# Patient Record
Sex: Female | Born: 1990 | Race: Black or African American | Hispanic: No | Marital: Single | Smoking: Never smoker
Health system: Southern US, Community
[De-identification: ages and names within clinical notes are randomized; demographics above are authoritative.]

## PROBLEM LIST (undated history)

## (undated) DIAGNOSIS — Z9289 Personal history of other medical treatment: Secondary | ICD-10-CM

## (undated) DIAGNOSIS — N12 Tubulo-interstitial nephritis, not specified as acute or chronic: Secondary | ICD-10-CM

## (undated) DIAGNOSIS — Z789 Other specified health status: Secondary | ICD-10-CM

## (undated) DIAGNOSIS — Z9889 Other specified postprocedural states: Secondary | ICD-10-CM

## (undated) HISTORY — DX: Other specified health status: Z78.9

---

## 2014-04-09 ENCOUNTER — Other Ambulatory Visit (HOSPITAL_COMMUNITY)
Admission: RE | Admit: 2014-04-09 | Discharge: 2014-04-09 | Disposition: A | Discharge status: Home / Self Care | Payer: BC Managed Care – PPO | Source: Ambulatory Visit | Attending: Obstetrics & Gynecology | Admitting: Obstetrics & Gynecology

## 2014-04-09 ENCOUNTER — Other Ambulatory Visit: Payer: Self-pay | Admitting: Obstetrics & Gynecology

## 2014-04-09 DIAGNOSIS — Z1151 Encounter for screening for human papillomavirus (HPV): Secondary | ICD-10-CM | POA: Insufficient documentation

## 2014-04-09 DIAGNOSIS — Z113 Encounter for screening for infections with a predominantly sexual mode of transmission: Secondary | ICD-10-CM | POA: Insufficient documentation

## 2014-04-09 DIAGNOSIS — Z124 Encounter for screening for malignant neoplasm of cervix: Secondary | ICD-10-CM | POA: Insufficient documentation

## 2014-04-12 LAB — CYTOLOGY - PAP

## 2014-07-19 ENCOUNTER — Other Ambulatory Visit: Payer: Self-pay | Admitting: Obstetrics & Gynecology

## 2014-07-19 ENCOUNTER — Other Ambulatory Visit (HOSPITAL_COMMUNITY)
Admission: RE | Admit: 2014-07-19 | Discharge: 2014-07-19 | Disposition: A | Discharge status: Home / Self Care | Payer: BC Managed Care – PPO | Source: Ambulatory Visit | Attending: Obstetrics & Gynecology | Admitting: Obstetrics & Gynecology

## 2014-07-19 DIAGNOSIS — Z01411 Encounter for gynecological examination (general) (routine) with abnormal findings: Secondary | ICD-10-CM | POA: Insufficient documentation

## 2014-07-21 LAB — CYTOLOGY - PAP

## 2019-02-27 DIAGNOSIS — Z3689 Encounter for other specified antenatal screening: Secondary | ICD-10-CM | POA: Diagnosis not present

## 2019-02-27 DIAGNOSIS — Z3A10 10 weeks gestation of pregnancy: Secondary | ICD-10-CM | POA: Diagnosis not present

## 2020-09-05 DIAGNOSIS — O26899 Other specified pregnancy related conditions, unspecified trimester: Secondary | ICD-10-CM | POA: Diagnosis not present

## 2020-09-05 DIAGNOSIS — Z3A11 11 weeks gestation of pregnancy: Secondary | ICD-10-CM | POA: Diagnosis not present

## 2020-09-07 DIAGNOSIS — Z3490 Encounter for supervision of normal pregnancy, unspecified, unspecified trimester: Secondary | ICD-10-CM | POA: Diagnosis not present

## 2020-09-07 DIAGNOSIS — Z113 Encounter for screening for infections with a predominantly sexual mode of transmission: Secondary | ICD-10-CM | POA: Diagnosis not present

## 2020-09-07 LAB — OB RESULTS CONSOLE ABO/RH: RH Type: POSITIVE

## 2020-09-07 LAB — OB RESULTS CONSOLE HEPATITIS B SURFACE ANTIGEN: Hepatitis B Surface Ag: NEGATIVE

## 2020-09-07 LAB — OB RESULTS CONSOLE RPR: RPR: NONREACTIVE

## 2020-09-07 LAB — OB RESULTS CONSOLE HIV ANTIBODY (ROUTINE TESTING): HIV: NONREACTIVE

## 2020-09-07 LAB — OB RESULTS CONSOLE ANTIBODY SCREEN: Antibody Screen: NEGATIVE

## 2020-09-07 LAB — OB RESULTS CONSOLE GC/CHLAMYDIA
Chlamydia: NEGATIVE
Gonorrhea: NEGATIVE

## 2020-09-07 LAB — OB RESULTS CONSOLE RUBELLA ANTIBODY, IGM: Rubella: IMMUNE

## 2020-10-01 NOTE — L&D Delivery Note (Addendum)
Delivery Note Called to room for patient for complete dilation. Patient found to be complete and 0 to +1 station. Bed broken down and patient draped. At 7:38 PM a viable and healthy female was delivered via Vaginal, Spontaneous (Presentation: Right Occiput Anterior).  APGAR: 8,9 ; weight - see infant's chart.   Placenta status: Spontaneous, Intact.  Cord: 3 vessels with the following complications: None.  Cord pH: Not collected.  After delivery, brisk bleeding was noted prior to delivery of placenta. Pitocin was started. Placenta delivered intact. Bimanual massage performed with removal of lower uterine segment blood clots. Uterus found to be firm. Methergine 0.2mg  IM x 1 was given. Cervix, vagina, and perineum inspected and no lacerations were found. TXA 1g was given. Cytotec was placed per rectum as prophylaxis.  Anesthesia: Epidural Episiotomy: None Lacerations: None Suture Repair:  N/A Est. Blood Loss (mL):  500cc  Mom to postpartum.  Baby to Couplet care / Skin to Skin.  Steva Ready 03/22/2021, 7:53 PM

## 2021-03-03 LAB — OB RESULTS CONSOLE GBS: GBS: NEGATIVE

## 2021-03-10 ENCOUNTER — Encounter (HOSPITAL_COMMUNITY): Payer: Self-pay | Admitting: Obstetrics and Gynecology

## 2021-03-10 ENCOUNTER — Inpatient Hospital Stay (HOSPITAL_COMMUNITY): Payer: 59

## 2021-03-10 ENCOUNTER — Observation Stay (HOSPITAL_COMMUNITY)
Admission: EM | Admit: 2021-03-10 | Discharge: 2021-03-12 | Disposition: A | Discharge status: Home / Self Care | Payer: 59 | Attending: Obstetrics and Gynecology | Admitting: Obstetrics and Gynecology

## 2021-03-10 ENCOUNTER — Other Ambulatory Visit: Payer: Self-pay

## 2021-03-10 DIAGNOSIS — O26893 Other specified pregnancy related conditions, third trimester: Secondary | ICD-10-CM

## 2021-03-10 DIAGNOSIS — Z20822 Contact with and (suspected) exposure to covid-19: Secondary | ICD-10-CM | POA: Diagnosis not present

## 2021-03-10 DIAGNOSIS — Z3A37 37 weeks gestation of pregnancy: Secondary | ICD-10-CM | POA: Insufficient documentation

## 2021-03-10 DIAGNOSIS — O368330 Maternal care for abnormalities of the fetal heart rate or rhythm, third trimester, not applicable or unspecified: Secondary | ICD-10-CM | POA: Diagnosis not present

## 2021-03-10 DIAGNOSIS — O2300 Infections of kidney in pregnancy, unspecified trimester: Secondary | ICD-10-CM | POA: Diagnosis present

## 2021-03-10 DIAGNOSIS — Z3A00 Weeks of gestation of pregnancy not specified: Secondary | ICD-10-CM | POA: Diagnosis not present

## 2021-03-10 DIAGNOSIS — O2303 Infections of kidney in pregnancy, third trimester: Secondary | ICD-10-CM | POA: Diagnosis not present

## 2021-03-10 DIAGNOSIS — R52 Pain, unspecified: Secondary | ICD-10-CM

## 2021-03-10 DIAGNOSIS — R509 Fever, unspecified: Secondary | ICD-10-CM | POA: Diagnosis not present

## 2021-03-10 DIAGNOSIS — R102 Pelvic and perineal pain: Secondary | ICD-10-CM | POA: Diagnosis not present

## 2021-03-10 DIAGNOSIS — O368390 Maternal care for abnormalities of the fetal heart rate or rhythm, unspecified trimester, not applicable or unspecified: Secondary | ICD-10-CM

## 2021-03-10 LAB — COMPREHENSIVE METABOLIC PANEL
ALT: 15 U/L (ref 0–44)
AST: 16 U/L (ref 15–41)
Albumin: 2.8 g/dL — ABNORMAL LOW (ref 3.5–5.0)
Alkaline Phosphatase: 131 U/L — ABNORMAL HIGH (ref 38–126)
Anion gap: 11 (ref 5–15)
BUN: 8 mg/dL (ref 6–20)
CO2: 17 mmol/L — ABNORMAL LOW (ref 22–32)
Calcium: 9 mg/dL (ref 8.9–10.3)
Chloride: 106 mmol/L (ref 98–111)
Creatinine, Ser: 0.74 mg/dL (ref 0.44–1.00)
GFR, Estimated: >60 mL/min (ref >60)
Glucose, Bld: 121 mg/dL — ABNORMAL HIGH (ref 70–99)
Potassium: 3.4 mmol/L — ABNORMAL LOW (ref 3.5–5.1)
Sodium: 134 mmol/L — ABNORMAL LOW (ref 135–145)
Total Bilirubin: 0.8 mg/dL (ref 0.3–1.2)
Total Protein: 6.4 g/dL — ABNORMAL LOW (ref 6.5–8.1)

## 2021-03-10 LAB — CBC WITH DIFFERENTIAL/PLATELET
Abs Immature Granulocytes: 0.4 10*3/uL — ABNORMAL HIGH (ref 0.00–0.07)
Basophils Absolute: 0 10*3/uL (ref 0.0–0.1)
Basophils Relative: 0 %
Eosinophils Absolute: 0 10*3/uL (ref 0.0–0.5)
Eosinophils Relative: 0 %
HCT: 34.5 % — ABNORMAL LOW (ref 36.0–46.0)
Hemoglobin: 11.3 g/dL — ABNORMAL LOW (ref 12.0–15.0)
Immature Granulocytes: 3 %
Lymphocytes Relative: 5 %
Lymphs Abs: 0.6 10*3/uL — ABNORMAL LOW (ref 0.7–4.0)
MCH: 27.8 pg (ref 26.0–34.0)
MCHC: 32.8 g/dL (ref 30.0–36.0)
MCV: 85 fL (ref 80.0–100.0)
Monocytes Absolute: 0.6 10*3/uL (ref 0.1–1.0)
Monocytes Relative: 5 %
Neutro Abs: 11.3 10*3/uL — ABNORMAL HIGH (ref 1.7–7.7)
Neutrophils Relative %: 87 %
Platelets: 192 10*3/uL (ref 150–400)
RBC: 4.06 MIL/uL (ref 3.87–5.11)
RDW: 15.1 % (ref 11.5–15.5)
WBC: 12.9 10*3/uL — ABNORMAL HIGH (ref 4.0–10.5)
nRBC: 0 % (ref 0.0–0.2)

## 2021-03-10 LAB — URINALYSIS, ROUTINE W REFLEX MICROSCOPIC
Bilirubin Urine: NEGATIVE
Glucose, UA: NEGATIVE mg/dL
Ketones, ur: NEGATIVE mg/dL
Nitrite: NEGATIVE
Protein, ur: 100 mg/dL — AB
RBC / HPF: >50 RBC/hpf — ABNORMAL HIGH (ref 0–5)
Specific Gravity, Urine: 1.016 (ref 1.005–1.030)
WBC, UA: >50 WBC/hpf — ABNORMAL HIGH (ref 0–5)
pH: 6 (ref 5.0–8.0)

## 2021-03-10 LAB — RESP PANEL BY RT-PCR (FLU A&B, COVID) ARPGX2
Influenza A by PCR: NEGATIVE
Influenza B by PCR: NEGATIVE
SARS Coronavirus 2 by RT PCR: NEGATIVE

## 2021-03-10 MED ORDER — SODIUM CHLORIDE 0.9 % IV SOLN
2.0000 g | Freq: Once | INTRAVENOUS | Status: AC
  Administered 2021-03-10: 2 g via INTRAVENOUS
  Filled 2021-03-10: qty 20

## 2021-03-10 MED ORDER — SODIUM CHLORIDE 0.9 % IV SOLN
INTRAVENOUS | Status: DC
Start: 2021-03-10 — End: 2021-03-11

## 2021-03-10 MED ORDER — CALCIUM CARBONATE ANTACID 500 MG PO CHEW: 2.0000 | CHEWABLE_TABLET | ORAL | Status: DC | PRN

## 2021-03-10 MED ORDER — PRENATAL MULTIVITAMIN CH
1.0000 | ORAL_TABLET | Freq: Every day | ORAL | Status: DC
  Administered 2021-03-10 – 2021-03-12 (×2): 1 via ORAL
  Filled 2021-03-10 (×3): qty 1

## 2021-03-10 MED ORDER — DOCUSATE SODIUM 100 MG PO CAPS
100.0000 mg | ORAL_CAPSULE | Freq: Every day | ORAL | Status: DC
  Administered 2021-03-10 – 2021-03-12 (×3): 100 mg via ORAL
  Filled 2021-03-10 (×3): qty 1

## 2021-03-10 MED ORDER — SODIUM CHLORIDE 0.9 % IV SOLN
1.0000 g | INTRAVENOUS | Status: DC
  Administered 2021-03-11 – 2021-03-12 (×2): 1 g via INTRAVENOUS
  Filled 2021-03-10: qty 1
  Filled 2021-03-10: qty 10

## 2021-03-10 MED ORDER — LACTATED RINGERS IV SOLN: Freq: Once | INTRAVENOUS | Status: AC

## 2021-03-10 MED ORDER — BUTALBITAL-APAP-CAFFEINE 50-325-40 MG PO TABS
1.0000 | ORAL_TABLET | ORAL | Status: DC | PRN
  Administered 2021-03-10: 1 via ORAL
  Filled 2021-03-10: qty 1

## 2021-03-10 MED ORDER — HYDROMORPHONE HCL 1 MG/ML IJ SOLN
1.0000 mg | Freq: Once | INTRAMUSCULAR | Status: AC
  Administered 2021-03-10: 1 mg via INTRAVENOUS
  Filled 2021-03-10: qty 1

## 2021-03-10 MED ORDER — ZOLPIDEM TARTRATE 5 MG PO TABS
5.0000 mg | ORAL_TABLET | Freq: Every evening | ORAL | Status: DC | PRN
  Administered 2021-03-11: 5 mg via ORAL
  Filled 2021-03-10: qty 1

## 2021-03-10 MED ORDER — ACETAMINOPHEN 325 MG PO TABS
650.0000 mg | ORAL_TABLET | ORAL | Status: DC | PRN
  Administered 2021-03-10 (×2): 650 mg via ORAL
  Filled 2021-03-10 (×2): qty 2

## 2021-03-10 MED ORDER — CYCLOBENZAPRINE HCL 10 MG PO TABS
10.0000 mg | ORAL_TABLET | Freq: Three times a day (TID) | ORAL | Status: DC | PRN
  Administered 2021-03-10 – 2021-03-11 (×3): 10 mg via ORAL
  Filled 2021-03-10 (×3): qty 1

## 2021-03-10 MED ORDER — LACTATED RINGERS IV BOLUS
500.0000 mL | Freq: Once | INTRAVENOUS | Status: AC
  Administered 2021-03-10: 500 mL via INTRAVENOUS

## 2021-03-10 MED ORDER — SODIUM CHLORIDE 0.9 % IV SOLN: Freq: Once | INTRAVENOUS | Status: AC

## 2021-03-10 NOTE — ED Notes (Signed)
report given to Myra Gianotti at [facility]. Pt to go to waiting area via ED Tech and this RN

## 2021-03-10 NOTE — MAU Provider Note (Signed)
Chief Complaint:  Fever, Chills, Back Pain, and Dysuria   Event Date/Time   First Provider Initiated Contact with Patient 03/10/21 0342     HPI: Rachel Weiss is a 30 y.o. G3P0 at 39w2dwho presents to maternity admissions reporting fever and chills, seeing blood when she urinates. And pain in Suprapubic area and Left flank.  Also has some urinary frequency and urgency.   Took Tylenol before coming. . She reports good fetal movement, denies LOF, vaginal bleeding, vaginal itching/burning, urinary symptoms, h/a, dizziness, n/v, diarrhea, constipation or fever/chills.  She denies headache, visual changes or RUQ abdominal pain.  Fever  This is a new problem. The current episode started today. The problem has been gradually improving. Her temperature was unmeasured prior to arrival. Associated symptoms include abdominal pain (suprapubic), muscle aches and urinary pain. Pertinent negatives include no congestion, headaches, nausea or vomiting. She has tried acetaminophen for the symptoms. The treatment provided mild relief.  Dysuria  This is a new problem. The current episode started today. The problem occurs every urination. The quality of the pain is described as burning. The pain is moderate. There is No history of pyelonephritis. Associated symptoms include chills, frequency and hematuria. Pertinent negatives include no nausea or vomiting. She has tried acetaminophen for the symptoms. The treatment provided mild relief.   RN Note: Patient is approximately 8 months pregnant presenting to the emergency department with a chief complaint of generalized body aches, pelvic pain, vaginal discharge/bleeding.  She states that she feels a pelvic pressure.  She has not been pushing.  She is not certain if she has had a fluid gush.  She denies any sick contacts.  She has not taken a COVID test.  She is followed by [facility].  Past Medical History: History reviewed. No pertinent past medical history.  Past  obstetric history: OB History  Gravida Para Term Preterm AB Living  3         2  SAB IAB Ectopic Multiple Live Births               # Outcome Date GA Lbr Len/2nd Weight Sex Delivery Anes PTL Lv  3 Current           2 Gravida           1 [place]             Past Surgical History: History reviewed. No pertinent surgical history.  Family History: History reviewed. No pertinent family history.  Social History:    Allergies: Not on File  Meds:  No medications prior to admission.    I have reviewed patient's Past Medical Hx, Surgical Hx, Family Hx, Social Hx, medications and allergies.   ROS:  Review of Systems  Constitutional:  Positive for chills and fever.  HENT:  Negative for congestion.   Gastrointestinal:  Positive for abdominal pain (suprapubic). Negative for nausea and vomiting.  Genitourinary:  Positive for dysuria, frequency and hematuria.  Neurological:  Negative for headaches.  Other systems negative  Physical Exam  Patient Vitals for the past 24 hrs:  BP Temp Temp src Pulse Resp SpO2  03/10/21 0334 -- -- -- -- -- 98 %  03/10/21 0217 121/79 98.6 F (37 C) Oral (!) 151 20 98 %   Constitutional: Well-developed, well-nourished female in no acute distress, but quite uncomfortable.  Hesitant to allow abdomen to be touched.    Cardiovascular: normal rate and rhythm Respiratory: normal effort, clear to auscultation bilaterally GI: Abd soft, tender  over SP area, gravid appropriate for gestational age.   No rebound but mild guarding.    Left flank tenderness.  MS: Extremities nontender, no edema, normal ROM Neurologic: Alert and oriented x 4.  GU: Neg CVAT.  PELVIC EXAM: Cervix pink, visually closed, without lesion, scant white creamy discharge, vaginal walls and external genitalia normal   No blood visible Cervix 2-3cm/long/soft/ballotable   FHT:  Baseline 190s initially (99 degree fever) then 150s , moderate variability, accelerations present, no  decelerations Contractions:  Irregular     Labs: Results for orders placed or performed during the hospital encounter of 03/10/21 (from the past 24 hour(s))  Urinalysis, Routine w reflex microscopic     Status: Abnormal   Collection Time: 03/10/21  3:58 AM  Result Value Ref Range   Color, Urine AMBER (A) YELLOW   APPearance CLOUDY (A) CLEAR   Specific Gravity, Urine 1.016 1.005 - 1.030   pH 6.0 5.0 - 8.0   Glucose, UA NEGATIVE NEGATIVE mg/dL   Hgb urine dipstick LARGE (A) NEGATIVE   Bilirubin Urine NEGATIVE NEGATIVE   Ketones, ur NEGATIVE NEGATIVE mg/dL   Protein, ur 644 (A) NEGATIVE mg/dL   Nitrite NEGATIVE NEGATIVE   Leukocytes,Ua MODERATE (A) NEGATIVE   RBC / HPF >50 (H) 0 - 5 RBC/hpf   WBC, UA >50 (H) 0 - 5 WBC/hpf   Bacteria, UA FEW (A) NONE SEEN   Squamous Epithelial / LPF 11-20 0 - 5   WBC Clumps PRESENT    Mucus PRESENT    Non Squamous Epithelial 0-5 (A) NONE SEEN  CBC with Differential/Platelet     Status: Abnormal   Collection Time: 03/10/21  4:20 AM  Result Value Ref Range   WBC 12.9 (H) 4.0 - 10.5 K/uL   RBC 4.06 3.87 - 5.11 MIL/uL   Hemoglobin 11.3 (L) 12.0 - 15.0 g/dL   HCT 03.4 (L) 74.2 - 59.5 %   MCV 85.0 80.0 - 100.0 fL   MCH 27.8 26.0 - 34.0 pg   MCHC 32.8 30.0 - 36.0 g/dL   RDW 63.8 75.6 - 43.3 %   Platelets 192 150 - 400 K/uL   nRBC 0.0 0.0 - 0.2 %   Neutrophils Relative % 87 %   Neutro Abs 11.3 (H) 1.7 - 7.7 K/uL   Lymphocytes Relative 5 %   Lymphs Abs 0.6 (L) 0.7 - 4.0 K/uL   Monocytes Relative 5 %   Monocytes Absolute 0.6 0.1 - 1.0 K/uL   Eosinophils Relative 0 %   Eosinophils Absolute 0.0 0.0 - 0.5 K/uL   Basophils Relative 0 %   Basophils Absolute 0.0 0.0 - 0.1 K/uL   Immature Granulocytes 3 %   Abs Immature Granulocytes 0.40 (H) 0.00 - 0.07 K/uL  Comprehensive metabolic panel     Status: Abnormal   Collection Time: 03/10/21  4:20 AM  Result Value Ref Range   Sodium 134 (L) 135 - 145 mmol/L   Potassium 3.4 (L) 3.5 - 5.1 mmol/L    Chloride 106 98 - 111 mmol/L   CO2 17 (L) 22 - 32 mmol/L   Glucose, Bld 121 (H) 70 - 99 mg/dL   BUN 8 6 - 20 mg/dL   Creatinine, Ser 2.95 0.44 - 1.00 mg/dL   Calcium 9.0 8.9 - 18.8 mg/dL   Total Protein 6.4 (L) 6.5 - 8.1 g/dL   Albumin 2.8 (L) 3.5 - 5.0 g/dL   AST 16 15 - 41 U/L   ALT 15 0 - 44 U/L  Alkaline Phosphatase 131 (H) 38 - 126 U/L   Total Bilirubin 0.8 0.3 - 1.2 mg/dL   GFR, Estimated >28 >31 mL/min   Anion gap 11 5 - 15       Imaging:  US Renal  Result Date: 03/10/2021 CLINICAL DATA:  Left flank pain. Hematuria. Thirty-seven weeks pregnant. EXAM: RENAL / URINARY TRACT ULTRASOUND COMPLETE COMPARISON:  None. FINDINGS: Right Kidney: Renal measurements: 11.2 x 5.6 x 6.2 cm = volume: 195 mL. Echogenicity within normal limits. No mass or hydronephrosis visualized. Left Kidney: Renal measurements: 11.7 x 6.6 x 5.8 cm = volume: 234 mL. Echogenicity within normal limits. No mass or hydronephrosis visualized. Urinary bladder: Appears normal for degree of bladder distention. Other: None. IMPRESSION: Unremarkable renal ultrasound. Electronically Signed   By: Tish Frederickson M.D.   On: 03/10/2021 06:36     MAU Course/MDM: I have ordered labs and reviewed results. There is mild leukocytosis with a left shift.  UA is remarkable for hematuria, proteinuria, and leukocytes.  NST reviewed, initially tachycardic, but FHR came down over time.  Has been reactive.  Irregular contractions are noted, decreased with hydration..  Consult Drs Alysia Penna and Connye Burkitt with presentation, exam findings and test results.  Treatments in MAU included IV hydration, analgesia, Rocephin 2gm loading dose, Renal US.   Dr Connye Burkitt recommends admission for observation  Assessment: 1. Pelvic pain affecting pregnancy in third trimester, antepartum   2. Body aches   3.    SIUP at [redacted]w[redacted]d 4.    Pyelonephritis 5.     Fetal tachycardia related to fever  Plan: Admit to Antenatal for 24 hr observation IV hydration Rocephin  q 24hr (already had 2g load) NST q shift MD to follow.  Wynelle Bourgeois CNM, MSN Certified Nurse-Midwife 03/10/2021 3:42 AM

## 2021-03-10 NOTE — MAU Note (Signed)
Pt presents to MAU c/o burning with urination and L sided back pain beginning three days ago.  Pt endorses + urinary urgency.  Pt states that earlier this evening she developed a fever and chills.  Pt took 1 regular strength tylenol around 1930 this evening. Endorses +FM, denies LOF.  Pt states that she noticed light pink spotting beginning yesterday am.  Pt denies cough, congestion, respiratory complications.

## 2021-03-10 NOTE — ED Provider Notes (Signed)
[facility] EMERGENCY DEPARTMENT Provider Note   CSN: [id number] Arrival date & time: 03/10/21  1443     History No chief complaint on file.   Rachel Weiss is a 30 y.o. female.  Patient is approximately 8 months pregnant presenting to the emergency department with a chief complaint of generalized body aches, pelvic pain, vaginal discharge/bleeding.  She states that she feels a pelvic pressure.  She has not been pushing.  She is not certain if she has had a fluid gush.  She denies any sick contacts.  She has not taken a COVID test.  She is followed by [facility].  The history is provided by the patient. No language interpreter was used.      No past medical history on file.  There are no problems to display for this patient.   History reviewed. No pertinent surgical history.   OB History   No obstetric history on file.     No family history on file.     Home Medications Prior to Admission medications   Not on File    Allergies    Patient has no allergy information on record.  Review of Systems   Review of Systems  All other systems reviewed and are negative.  Physical Exam Updated Vital Signs BP 121/79 (BP Location: Left Arm)   Pulse (!) 151   Temp 98.6 F (37 C) (Oral)   Resp 20   SpO2 98%   Physical Exam Vitals and nursing note reviewed.  Constitutional:      General: She is not in acute distress.    Appearance: She is well-developed.  HENT:     Head: Normocephalic and atraumatic.  Eyes:     Conjunctiva/sclera: Conjunctivae normal.  Cardiovascular:     Rate and Rhythm: Tachycardia present.     Heart sounds: No murmur heard. Pulmonary:     Effort: Pulmonary effort is normal. No respiratory distress.  Abdominal:     General: There is no distension.     Comments: gravid  Musculoskeletal:     Cervical back: Neck supple.     Comments: Moves all extremities  Skin:    General: Skin is warm and dry.  Neurological:      Mental Status: She is alert and oriented to person, place, and time.  Psychiatric:        Mood and Affect: Mood normal.        Behavior: Behavior normal.    ED Results / Procedures / Treatments   Labs (all labs ordered are listed, but only abnormal results are displayed) Labs Reviewed - No data to display  EKG None  Radiology No results found.  Procedures Procedures   Medications Ordered in ED Medications - No data to display  ED Course  I have reviewed the triage vital signs and the nursing notes.  Pertinent labs & imaging results that were available during my care of the patient were reviewed by me and considered in my medical decision making (see chart for details).    MDM Rules/Calculators/A&P                          Patient is approximately 8 months pregnant complaining of multiple complaints including generalized body aches, pelvic pain and pressure and some vaginal discharge which she describes as pink.  She is noted to be tachycardic in triage to 151.  EKG shows sinus tachycardia.  She is not hypotensive.  She is afebrile.  I contacted MAU APP, Hilda Lias, who accepts in transfer.  Patient does look stable for transfer to MAU.  RN, Fredric Mare and tech to take patient to MAU.  Final Clinical Impression(s) / ED Diagnoses Final diagnoses:  Pelvic pain affecting pregnancy in third trimester, antepartum  Body aches    Rx / DC Orders ED Discharge Orders     None        Roxy Horseman, PA-C 03/10/21 0234    Nira Conn, MD 03/11/21 [phone]

## 2021-03-10 NOTE — H&P (Signed)
Chief Complaint:  Fever, Chills, Back Pain, and Dysuria   Event Date/Time   First Provider Initiated Contact with Patient 03/10/21 0342     HPI: Rachel Weiss is a 30 y.o. G3P0 at 39w2dwho presents to maternity admissions reporting fever and chills, seeing blood when she urinates. And pain in Suprapubic area and Left flank.  Also has some urinary frequency and urgency.   Took Tylenol before coming. . She reports good fetal movement, denies LOF, vaginal bleeding, vaginal itching/burning, urinary symptoms, h/a, dizziness, n/v, diarrhea, constipation or fever/chills.  She denies headache, visual changes or RUQ abdominal pain.  Fever  This is a new problem. The current episode started today. The problem has been gradually improving. Her temperature was unmeasured prior to arrival. Associated symptoms include abdominal pain (suprapubic), muscle aches and urinary pain. Pertinent negatives include no congestion, headaches, nausea or vomiting. She has tried acetaminophen for the symptoms. The treatment provided mild relief.  Dysuria  This is a new problem. The current episode started today. The problem occurs every urination. The quality of the pain is described as burning. The pain is moderate. There is No history of pyelonephritis. Associated symptoms include chills, frequency and hematuria. Pertinent negatives include no nausea or vomiting. She has tried acetaminophen for the symptoms. The treatment provided mild relief.   RN Note: Patient is approximately 8 months pregnant presenting to the emergency department with a chief complaint of generalized body aches, pelvic pain, vaginal discharge/bleeding.  She states that she feels a pelvic pressure.  She has not been pushing.  She is not certain if she has had a fluid gush.  She denies any sick contacts.  She has not taken a COVID test.  She is followed by [facility].  Past Medical History: History reviewed. No pertinent past medical history.  Past  obstetric history: OB History  Gravida Para Term Preterm AB Living  3         2  SAB IAB Ectopic Multiple Live Births               # Outcome Date GA Lbr Len/2nd Weight Sex Delivery Anes PTL Lv  3 Current           2 Gravida           1 [place]             Past Surgical History: History reviewed. No pertinent surgical history.  Family History: History reviewed. No pertinent family history.  Social History:    Allergies: Not on File  Meds:  No medications prior to admission.    I have reviewed patient's Past Medical Hx, Surgical Hx, Family Hx, Social Hx, medications and allergies.   ROS:  Review of Systems  Constitutional:  Positive for chills and fever.  HENT:  Negative for congestion.   Gastrointestinal:  Positive for abdominal pain (suprapubic). Negative for nausea and vomiting.  Genitourinary:  Positive for dysuria, frequency and hematuria.  Neurological:  Negative for headaches.  Other systems negative  Physical Exam  Patient Vitals for the past 24 hrs:  BP Temp Temp src Pulse Resp SpO2  03/10/21 0334 -- -- -- -- -- 98 %  03/10/21 0217 121/79 98.6 F (37 C) Oral (!) 151 20 98 %   Constitutional: Well-developed, well-nourished female in no acute distress, but quite uncomfortable.  Hesitant to allow abdomen to be touched.    Cardiovascular: normal rate and rhythm Respiratory: normal effort, clear to auscultation bilaterally GI: Abd soft, tender  over SP area, gravid appropriate for gestational age.   No rebound but mild guarding.    Left flank tenderness.  MS: Extremities nontender, no edema, normal ROM Neurologic: Alert and oriented x 4.  GU: Neg CVAT.  PELVIC EXAM: Cervix pink, visually closed, without lesion, scant white creamy discharge, vaginal walls and external genitalia normal   No blood visible Cervix 2-3cm/long/soft/ballotable   FHT:  Baseline 190s initially (99 degree fever) then 150s , moderate variability, accelerations present, no  decelerations Contractions:  Irregular     Labs: Results for orders placed or performed during the hospital encounter of 03/10/21 (from the past 24 hour(s))  Urinalysis, Routine w reflex microscopic     Status: Abnormal   Collection Time: 03/10/21  3:58 AM  Result Value Ref Range   Color, Urine AMBER (A) YELLOW   APPearance CLOUDY (A) CLEAR   Specific Gravity, Urine 1.016 1.005 - 1.030   pH 6.0 5.0 - 8.0   Glucose, UA NEGATIVE NEGATIVE mg/dL   Hgb urine dipstick LARGE (A) NEGATIVE   Bilirubin Urine NEGATIVE NEGATIVE   Ketones, ur NEGATIVE NEGATIVE mg/dL   Protein, ur 644 (A) NEGATIVE mg/dL   Nitrite NEGATIVE NEGATIVE   Leukocytes,Ua MODERATE (A) NEGATIVE   RBC / HPF >50 (H) 0 - 5 RBC/hpf   WBC, UA >50 (H) 0 - 5 WBC/hpf   Bacteria, UA FEW (A) NONE SEEN   Squamous Epithelial / LPF 11-20 0 - 5   WBC Clumps PRESENT    Mucus PRESENT    Non Squamous Epithelial 0-5 (A) NONE SEEN  CBC with Differential/Platelet     Status: Abnormal   Collection Time: 03/10/21  4:20 AM  Result Value Ref Range   WBC 12.9 (H) 4.0 - 10.5 K/uL   RBC 4.06 3.87 - 5.11 MIL/uL   Hemoglobin 11.3 (L) 12.0 - 15.0 g/dL   HCT 03.4 (L) 74.2 - 59.5 %   MCV 85.0 80.0 - 100.0 fL   MCH 27.8 26.0 - 34.0 pg   MCHC 32.8 30.0 - 36.0 g/dL   RDW 63.8 75.6 - 43.3 %   Platelets 192 150 - 400 K/uL   nRBC 0.0 0.0 - 0.2 %   Neutrophils Relative % 87 %   Neutro Abs 11.3 (H) 1.7 - 7.7 K/uL   Lymphocytes Relative 5 %   Lymphs Abs 0.6 (L) 0.7 - 4.0 K/uL   Monocytes Relative 5 %   Monocytes Absolute 0.6 0.1 - 1.0 K/uL   Eosinophils Relative 0 %   Eosinophils Absolute 0.0 0.0 - 0.5 K/uL   Basophils Relative 0 %   Basophils Absolute 0.0 0.0 - 0.1 K/uL   Immature Granulocytes 3 %   Abs Immature Granulocytes 0.40 (H) 0.00 - 0.07 K/uL  Comprehensive metabolic panel     Status: Abnormal   Collection Time: 03/10/21  4:20 AM  Result Value Ref Range   Sodium 134 (L) 135 - 145 mmol/L   Potassium 3.4 (L) 3.5 - 5.1 mmol/L    Chloride 106 98 - 111 mmol/L   CO2 17 (L) 22 - 32 mmol/L   Glucose, Bld 121 (H) 70 - 99 mg/dL   BUN 8 6 - 20 mg/dL   Creatinine, Ser 2.95 0.44 - 1.00 mg/dL   Calcium 9.0 8.9 - 18.8 mg/dL   Total Protein 6.4 (L) 6.5 - 8.1 g/dL   Albumin 2.8 (L) 3.5 - 5.0 g/dL   AST 16 15 - 41 U/L   ALT 15 0 - 44 U/L  Alkaline Phosphatase 131 (H) 38 - 126 U/L   Total Bilirubin 0.8 0.3 - 1.2 mg/dL   GFR, Estimated >60 >60 mL/min   Anion gap 11 5 - 15       Imaging:  US Renal  Result Date: 03/10/2021 CLINICAL DATA:  Left flank pain. Hematuria. Thirty-seven weeks pregnant. EXAM: RENAL / URINARY TRACT ULTRASOUND COMPLETE COMPARISON:  None. FINDINGS: Right Kidney: Renal measurements: 11.2 x 5.6 x 6.2 cm = volume: 195 mL. Echogenicity within normal limits. No mass or hydronephrosis visualized. Left Kidney: Renal measurements: 11.7 x 6.6 x 5.8 cm = volume: 234 mL. Echogenicity within normal limits. No mass or hydronephrosis visualized. Urinary bladder: Appears normal for degree of bladder distention. Other: None. IMPRESSION: Unremarkable renal ultrasound. Electronically Signed   By: Morgane  Naveau M.D.   On: 03/10/2021 06:36     MAU Course/MDM: I have ordered labs and reviewed results. There is mild leukocytosis with a left shift.  UA is remarkable for hematuria, proteinuria, and leukocytes.  NST reviewed, initially tachycardic, but FHR came down over time.  Has been reactive.  Irregular contractions are noted, decreased with hydration..  Consult Drs Ervin and Davies with presentation, exam findings and test results.  Treatments in MAU included IV hydration, analgesia, Rocephin 2gm loading dose, Renal US.   Dr Davies recommends admission for observation  Assessment: 1. Pelvic pain affecting pregnancy in third trimester, antepartum   2. Body aches   3.    SIUP at [redacted]w[redacted]d 4.    Pyelonephritis 5.     Fetal tachycardia related to fever  Plan: Admit to Antenatal for 24 hr observation IV hydration Rocephin  q 24hr (already had 2g load) NST q shift MD to follow.  Rachel Weiss CNM, MSN Certified Nurse-Midwife 03/10/2021 3:42 AM 

## 2021-03-10 NOTE — Progress Notes (Signed)
RN called, baseline 180s, Maternal temp 99.2, ordered bolus and tylenol and keep on monitor until resolves.

## 2021-03-10 NOTE — Procedures (Signed)
Fetal Non-Stress Test  Patient Name:  Rachel Weiss   Gestational age:  [redacted]w[redacted]d Indication(s):  Pyelonephritis  Baseline:  150bpm Variability:  Moderate Accelerations:  15x15s Decelerations:  None Contractions:  None  Interpretation:  Reactive NST  Steva Ready, DO

## 2021-03-11 DIAGNOSIS — O2303 Infections of kidney in pregnancy, third trimester: Secondary | ICD-10-CM | POA: Diagnosis not present

## 2021-03-11 MED ORDER — LACTATED RINGERS IV BOLUS
500.0000 mL | Freq: Once | INTRAVENOUS | Status: AC
  Administered 2021-03-11: 500 mL via INTRAVENOUS

## 2021-03-11 MED ORDER — HYDROMORPHONE HCL 1 MG/ML IJ SOLN
0.5000 mg | Freq: Once | INTRAMUSCULAR | Status: AC
  Administered 2021-03-11: 0.5 mg via INTRAVENOUS
  Filled 2021-03-11: qty 0.5

## 2021-03-11 MED ORDER — OXYCODONE-ACETAMINOPHEN 5-325 MG PO TABS
1.0000 | ORAL_TABLET | ORAL | Status: DC | PRN
  Administered 2021-03-11: 2 via ORAL
  Filled 2021-03-11: qty 2

## 2021-03-11 NOTE — Progress Notes (Addendum)
Subjective:    Reports feeling better since starting IV antibiotics, but states Tylenol and Flexeril are not effective for pain management. Denies blood in urine and denies seeing any blood when wiping.Discussed urine culture results and plan for treatment. Pt desires to be discharged today to care for her other children.   Objective:    VS: BP 113/67 (BP Location: Right Arm)   Pulse (!) 118   Temp 99 F (37.2 C) (Oral)   Resp 18   Ht 5\' 4"  (1.626 m)   Wt 101.2 kg   SpO2 100%   BMI 38.28 kg/m  FHR : baseline 155 / variability moderate / accelerations present / absent decelerations Toco: none Membranes: intact   Gen: AAO x3 CV: RRR Resp: unlabored, cleat GI: soft, gravid, non-distended GU: voiding clear yellow urine Ext: no swelling, pain, tenderness, or cords   Assessment/Plan:   30 y.o. 37 [redacted]w[redacted]d Pyelonephritis Fetal well-being    -fetal tachycardia when EFM initially applied, baseline decreased to 150's, then reacitve    -fetal surveillance appropriate for GA Urine culture     -gram negative rods    -sensitivities pending Dilaudid for pain now, then Percocet PRN

## 2021-03-12 DIAGNOSIS — O2303 Infections of kidney in pregnancy, third trimester: Secondary | ICD-10-CM | POA: Diagnosis not present

## 2021-03-12 LAB — CULTURE, OB URINE: Culture: 50000 — AB

## 2021-03-12 MED ORDER — CEPHALEXIN 500 MG PO CAPS: 500.0000 mg | ORAL_CAPSULE | Freq: Four times a day (QID) | ORAL | 0 refills | Status: AC

## 2021-03-12 NOTE — Progress Notes (Addendum)
Reviewed discharge antepartum instructions with patient regarding signs and symptoms of labor, kick counts, signs and symptoms of pre-e, medications, when to call MD/go to MAU, and to call OB office on Monday for follow up appointment. Patient verbalized understanding of antepartum discharge instructions and asked appropriate questions.

## 2021-03-12 NOTE — Plan of Care (Signed)
  Problem: Education: Goal: Knowledge of General Education information will improve Description: Including pain rating scale, medication(s)/side effects and non-pharmacologic comfort measures Outcome: Adequate for Discharge   Problem: Health Behavior/Discharge Planning: Goal: Ability to manage health-related needs will improve Outcome: Adequate for Discharge   Problem: Clinical Measurements: Goal: Ability to maintain clinical measurements within normal limits will improve Outcome: Adequate for Discharge Goal: Will remain free from infection Outcome: Adequate for Discharge Goal: Diagnostic test results will improve Outcome: Adequate for Discharge Goal: Respiratory complications will improve Outcome: Adequate for Discharge Goal: Cardiovascular complication will be avoided Outcome: Adequate for Discharge   Problem: Activity: Goal: Risk for activity intolerance will decrease Outcome: Adequate for Discharge   Problem: Nutrition: Goal: Adequate nutrition will be maintained Outcome: Adequate for Discharge   Problem: Coping: Goal: Level of anxiety will decrease Outcome: Adequate for Discharge   Problem: Elimination: Goal: Will not experience complications related to bowel motility Outcome: Adequate for Discharge Goal: Will not experience complications related to urinary retention Outcome: Adequate for Discharge   Problem: Pain Managment: Goal: General experience of comfort will improve Outcome: Adequate for Discharge   Problem: Safety: Goal: Ability to remain free from injury will improve Outcome: Adequate for Discharge   Problem: Skin Integrity: Goal: Risk for impaired skin integrity will decrease Outcome: Adequate for Discharge   Problem: Education: Goal: Knowledge of disease or condition will improve Outcome: Adequate for Discharge Goal: Knowledge of the prescribed therapeutic regimen will improve Outcome: Adequate for Discharge Goal: Individualized Educational  Video(s) Outcome: Adequate for Discharge   Problem: Clinical Measurements: Goal: Complications related to the disease process, condition or treatment will be avoided or minimized Outcome: Adequate for Discharge   Problem: Urinary Elimination: Goal: Signs and symptoms of infection will decrease Outcome: Adequate for Discharge

## 2021-03-12 NOTE — Discharge Summary (Addendum)
[facility] Discharge Summary    [facility] Physician of Dr Connye Burkitt     Patient Name: Rachel Weiss DOB: [birth year] MRN: [id number]  Date of Admission: 03/10/2021 Date of Admission: 03/12/2021  Admitting diagnosis: Pyelonephritis affecting pregnancy [O23.00] Intrauterine pregnancy: [redacted]w[redacted]d     Secondary diagnosis:  Active Problems:   Pyelonephritis affecting pregnancy                              History of Present Illness: Rachel Weiss is a 30 y.o. female, [id number], who presents at 108w4d weeks gestation. The patient has been followed at  [facility]. Her pregnancy has been complicated by:  Patient Active Problem List   Diagnosis Date Noted   Pyelonephritis affecting pregnancy 03/10/2021     Active Ambulatory Problems    Diagnosis Date Noted   No Active Ambulatory Problems   Resolved Ambulatory Problems    Diagnosis Date Noted   No Resolved Ambulatory Problems   No Additional Past Medical History     Hospital course:   HPI on 6/10: HPI: Rachel Weiss is a 30 y.o. G3P0 at 21w2dwho presents to maternity admissions reporting fever and chills, seeing blood when she urinates. And pain in Suprapubic area and Left flank.  Also has some urinary frequency and urgency.   Took Tylenol before coming. . She reports good fetal movement, denies LOF, vaginal bleeding, vaginal itching/burning, urinary symptoms, h/a, dizziness, n/v, diarrhea, constipation or fever/chills.  She denies headache, visual changes or RUQ abdominal pain.   Fever  This is a new problem. The current episode started today. The problem has been gradually improving. Her temperature was unmeasured prior to arrival. Associated symptoms include abdominal pain (suprapubic), muscle aches and urinary pain. Pertinent negatives include no congestion, headaches, nausea or vomiting. She has tried acetaminophen for the symptoms. The treatment provided mild relief. Dysuria  This is a new problem. The current episode started today. The  problem occurs every urination. The quality of the pain is described as burning. The pain is moderate. There is No history of pyelonephritis. Associated symptoms include chills, frequency and hematuria. Pertinent negatives include no nausea or vomiting. She has tried acetaminophen for the symptoms. The treatment provided mild relief.   RN Note: Patient is approximately 8 months pregnant presenting to the emergency department with a chief complaint of generalized body aches, pelvic pain, vaginal discharge/bleeding.  She states that she feels a pelvic pressure.  She has not been pushing.  She is not certain if she has had a fluid gush.  She denies any sick contacts.  She has not taken a COVID test.  She is followed by [facility].  03/10/2021: Fetal tacycardi awas noted with maternal temp of 99.2, bolus and tylelenol resolved fetal tachycardia. Pt still had flank pain and chills. Received x1 dose rocephin.  03/11/2021 @ 1800 fetal tachycardia was noted again for 20 mins and resolved with fluid. Pt still had flank pain and chills. Received x2 doses rocephin. UC + Ecoli 50,000 colonies  03/12/2021 today: Pt stable in bed, denies pain, denies fever or chills, denies dysuria or hematuria, NST was reactive and baseline 150s, with +acel and -decles, Cat 1 strip, spoke with pt about being discharged, pt desires to be discharged today , prescribed keflex 500mg  QID x14 days sent to pharmacy pt aware. Susceptibilities to ceftriaxone and cefamine noted.   AMPICILLIN >=32 RESIST... Resistant     AMPICILLIN/SULBACTAM 8 SENSITIVE  Sensitive    CEFAZOLIN <=4 SENSITIVE  Sensitive    CEFEPIME <=0.12 SENS... Sensitive    CEFTRIAXONE <=0.25 SENS... Sensitive    CIPROFLOXACIN <=0.25 SENS... Sensitive    GENTAMICIN <=1 SENSITIVE  Sensitive    IMIPENEM <=0.25 SENS... Sensitive    NITROFURANTOIN <=16 SENSIT... Sensitive    PIP/TAZO <=4 SENSITIVE  Sensitive    TRIMETH/SULFA <=20 SENSIT... Sensitive    Pt stable and  afebriles >24 hours. Pt verbalized she will call DR Connye Burkitt in the morning to have f/u appointment, and IOL scheduled for 6/22. DR [facility] consults and verbalized agreement with POC.   Physical exam  Vitals:   03/11/21 1637 03/11/21 2029 03/11/21 2235 03/12/21 0657  BP: 113/67 109/68 (!) 96/52 (!) 104/59  Pulse: (!) 118 (!) 115 (!) 115 (!) 110  Resp: 18 19 16    Temp: 99 F (37.2 C) 98.1 F (36.7 C) 98.2 F (36.8 C)   TempSrc: Oral Oral Oral   SpO2: 100% 99% 98% 98%  Weight:      Height:       General: alert, cooperative, and no distress Constitutional: Well-developed, well-nourished female in no acute distress. Cardiovascular: normal rate and rhythm Respiratory: normal effort, clear to auscultation bilaterally GI: Abd soft, non tender, gravid appropriate for gestational age.   No rebound.    Denies Left flank tenderness. MS: Extremities nontender, no edema, normal ROM Neurologic: Alert and oriented x 4. GU: Neg CVAT. Lochia: appropriate Uterine Fundus: firm Perineum: Intact DVT Evaluation: No evidence of DVT seen on physical exam. Negative Homan's sign. No cords or calf tenderness. No significant calf/ankle edema.  Labs: Lab Results  Component Value Date   WBC 12.9 (H) 03/10/2021   HGB 11.3 (L) 03/10/2021   HCT 34.5 (L) 03/10/2021   MCV 85.0 03/10/2021   PLT 192 03/10/2021   CMP Latest Ref Rng & Units 03/10/2021  Glucose 70 - 99 mg/dL 05/10/2021)  BUN 6 - 20 mg/dL 8  Creatinine 505(L - 9.76 mg/dL 7.34  Sodium 1.93 - 790 mmol/L 134(L)  Potassium 3.5 - 5.1 mmol/L 3.4(L)  Chloride 98 - 111 mmol/L 106  CO2 22 - 32 mmol/L 17(L)  Calcium 8.9 - 10.3 mg/dL 9.0  Total Protein 6.5 - 8.1 g/dL 6.4(L)  Total Bilirubin 0.3 - 1.2 mg/dL 0.8  Alkaline Phos 38 - 126 U/L 131(H)  AST 15 - 41 U/L 16  ALT 0 - 44 U/L 15    Date of discharge: 03/12/2021 Discharge Diagnoses:  Pyelonephritis Discharge instruction: per After Visit Summary and "Baby and Me Booklet".  After visit meds:    Activity:           unrestricted Advance as tolerated. Pelvic rest for 6 weeks.  Diet:                routine Medications: PNV and keflex 500mg  QID  Condition:  Pt discharge to home in stable condition  Pyelo: Take abx, monitor s/sx, report fever, encouraged pt to take iron and stay very hydrated. IOL on 6/22  Meds: Allergies as of 03/12/2021       Reactions   Azithromycin    Doxycycline         Medication List     TAKE these medications    cephALEXin 500 MG capsule Commonly known as: KEFLEX Take 1 capsule (500 mg total) by mouth 4 (four) times daily for 14 days.        Discharge Follow Up:   Follow-up Information  Gynecology, [facility]. Schedule an appointment as soon as possible for a visit.   Specialty: Obstetrics and Gynecology Why: Pt verbilized she will call Monday morning to speak with DR Connye Burkitt for follow up this week, IOL set for June 22 Contact information: [address] [place] [address] [phone]                  [facility], NP-C, CNM 03/12/2021, 11:22 AM  Dale [place], FNP

## 2021-03-13 ENCOUNTER — Telehealth (HOSPITAL_COMMUNITY): Payer: Self-pay | Admitting: *Deleted

## 2021-03-13 NOTE — H&P (Signed)
HPI: 30 y.o. [id number] @ [redacted]w[redacted]d  estimated gestational age (as dated by 10 week ultrasound) presents for elective induction of labor/TOLAC.  Leakage of fluid:  No Vaginal bleeding:  No Contractions:  No Fetal movement:  Yes  Prenatal care has been provided by Dr. Steva Ready [facility])  ROS:  Denies fevers, chills, chest pain, visual changes, SOB, RUQ/epigastric pain, N/V, dysuria, hematuria, or sudden onset/worsening bilateral LE or facial edema.  Pregnancy complicated by: History of CS x 1 (fetus OP) Pyelonephritis this pregnancy  Obesity (BMI 30) Anxiety/depression History of PPH requiring blood transfusion History of LEEP Desires permanent sterilization  Prenatal Transfer Tool  Maternal Diabetes: No Genetic Screening: Normal Maternal Ultrasounds/Referrals: Normal Fetal Ultrasounds or other Referrals:  None Maternal Substance Abuse:  No Significant Maternal Medications:  None Significant Maternal Lab Results: Group B Strep negative   Prenatal Labs Blood type:  O Positive  Antibody screen:  Negative CBC:  H/H 10.7/32.6 Rubella: Immune RPR:  Non-reactive Hep B:  Negative Hep C:  Negative HIV:  Negative GC/CT:  Negative Glucola:  77 (wnl)  Immunizations: Tdap: Given prenatally Flu: Previously declined  OBHx:  OB History     Gravida  5   Para  2   Term  2   Preterm      AB  2   Living  2      SAB  1   IAB  1   Ectopic      Multiple      Live Births  2          PMHx:  See above Meds:  PNV, Keflex Allergy:   Allergies  Allergen Reactions   Azithromycin    Doxycycline    SurgHx: CS x 1 SocHx:   Denies Tobacco, ETOH, illicit drugs  O: There were no vitals taken for this visit. Gen. AAOx3, NAD CV.  RRR  Resp. CTAB, no wheezes/rales/rhonchi Abd. Gravid, soft, non-tender throughout, no rebound/guarding Extr.  No bilateral LE edema, no calf tenderness bilaterally SVE (03/13/21): 4/50/high, sutures palpated   Last [place] (02/21/21):   [redacted]w[redacted]d, EFW 2599g (54%), Cephalic, AAFV, posterior placenta  Labs: see orders  A/P:  30 y.o. [id number] @ [redacted]w[redacted]d who presents for elective induction of labor/TOLAC.  Elective IOL/TOLAC - Admit to L&D - Admit labs (CBC, T&S, COVID screen) - CEFM/Toco - Diet:  Clear liquids - IVF:  LR at 125cc/hour - VTE Prophylaxis:  SCDs - GBS Status:  Negative - Presentation:  Confirm prior to start of IOL - Pain control:  Per patient rqeuest - Induction method:  Pitocin 1x1  History of CS x 1 (fetus OP) - Reviewed risks/benefits of TOLAC vs repeat CS, desires TOLAC  Pyelonephritis this pregnancy  - S/p admission from 6/10-6/12 - S/p IV antibiotics, transitioned to Keflex PO - Urine culture revealed E. Coli  Obesity (BMI 30) - Last growth [place] as documented above - Recommend early ambulation postpartum  Anxiety/depression - Medications: None, previously on Lexapro - Mood stable  History of PPH requiring blood transfusion - EBL after CS was 1000cc and received 2u pRBCs prior to hospital discharge  History of LEEP - Pap smear NILM (+) HRHPV (neg 16/18) this pregnancy - Repeat pap smear in 1 year  Desires permanent sterilization - Plan for interval bilateral salpingectomy  Steva Ready, DO [phone] (office)

## 2021-03-13 NOTE — Telephone Encounter (Signed)
Preadmission screen  

## 2021-03-15 ENCOUNTER — Encounter (HOSPITAL_COMMUNITY): Payer: Self-pay | Admitting: *Deleted

## 2021-03-15 ENCOUNTER — Telehealth (HOSPITAL_COMMUNITY): Payer: Self-pay | Admitting: *Deleted

## 2021-03-15 DIAGNOSIS — F419 Anxiety disorder, unspecified: Secondary | ICD-10-CM

## 2021-03-15 DIAGNOSIS — Z9289 Personal history of other medical treatment: Secondary | ICD-10-CM

## 2021-03-15 DIAGNOSIS — Z8759 Personal history of other complications of pregnancy, childbirth and the puerperium: Secondary | ICD-10-CM

## 2021-03-15 DIAGNOSIS — Z9889 Other specified postprocedural states: Secondary | ICD-10-CM

## 2021-03-15 DIAGNOSIS — E669 Obesity, unspecified: Secondary | ICD-10-CM

## 2021-03-15 DIAGNOSIS — Z98891 History of uterine scar from previous surgery: Secondary | ICD-10-CM

## 2021-03-15 NOTE — Telephone Encounter (Signed)
Preadmission screen  

## 2021-03-20 ENCOUNTER — Other Ambulatory Visit (HOSPITAL_COMMUNITY)
Admission: RE | Admit: 2021-03-20 | Discharge: 2021-03-20 | Disposition: A | Discharge status: Home / Self Care | Payer: 59 | Source: Ambulatory Visit | Attending: Obstetrics and Gynecology | Admitting: Obstetrics and Gynecology

## 2021-03-20 DIAGNOSIS — Z20822 Contact with and (suspected) exposure to covid-19: Secondary | ICD-10-CM | POA: Insufficient documentation

## 2021-03-20 DIAGNOSIS — Z01812 Encounter for preprocedural laboratory examination: Secondary | ICD-10-CM | POA: Insufficient documentation

## 2021-03-20 LAB — SARS CORONAVIRUS 2 (TAT 6-24 HRS): SARS Coronavirus 2: NEGATIVE

## 2021-03-22 ENCOUNTER — Inpatient Hospital Stay (HOSPITAL_COMMUNITY): Payer: 59 | Admitting: Anesthesiology

## 2021-03-22 ENCOUNTER — Inpatient Hospital Stay (HOSPITAL_COMMUNITY): Payer: 59

## 2021-03-22 ENCOUNTER — Other Ambulatory Visit: Payer: Self-pay

## 2021-03-22 ENCOUNTER — Encounter (HOSPITAL_COMMUNITY): Payer: Self-pay | Admitting: Obstetrics and Gynecology

## 2021-03-22 ENCOUNTER — Inpatient Hospital Stay (HOSPITAL_COMMUNITY)
Admission: AD | Admit: 2021-03-22 | Discharge: 2021-03-24 | DRG: 807 | Disposition: A | Discharge status: Home / Self Care | Payer: 59 | Attending: Obstetrics and Gynecology | Admitting: Obstetrics and Gynecology

## 2021-03-22 DIAGNOSIS — Z20822 Contact with and (suspected) exposure to covid-19: Secondary | ICD-10-CM | POA: Diagnosis present

## 2021-03-22 DIAGNOSIS — O34219 Maternal care for unspecified type scar from previous cesarean delivery: Secondary | ICD-10-CM | POA: Diagnosis present

## 2021-03-22 DIAGNOSIS — O99214 Obesity complicating childbirth: Secondary | ICD-10-CM | POA: Diagnosis present

## 2021-03-22 DIAGNOSIS — Z98891 History of uterine scar from previous surgery: Secondary | ICD-10-CM

## 2021-03-22 DIAGNOSIS — Z9289 Personal history of other medical treatment: Secondary | ICD-10-CM

## 2021-03-22 DIAGNOSIS — Z8759 Personal history of other complications of pregnancy, childbirth and the puerperium: Secondary | ICD-10-CM

## 2021-03-22 DIAGNOSIS — F419 Anxiety disorder, unspecified: Secondary | ICD-10-CM

## 2021-03-22 DIAGNOSIS — Z3A39 39 weeks gestation of pregnancy: Secondary | ICD-10-CM

## 2021-03-22 DIAGNOSIS — F32A Depression, unspecified: Secondary | ICD-10-CM

## 2021-03-22 DIAGNOSIS — Z9889 Other specified postprocedural states: Secondary | ICD-10-CM

## 2021-03-22 DIAGNOSIS — E669 Obesity, unspecified: Secondary | ICD-10-CM

## 2021-03-22 DIAGNOSIS — O26893 Other specified pregnancy related conditions, third trimester: Secondary | ICD-10-CM | POA: Diagnosis present

## 2021-03-22 DIAGNOSIS — Z3490 Encounter for supervision of normal pregnancy, unspecified, unspecified trimester: Secondary | ICD-10-CM | POA: Diagnosis present

## 2021-03-22 HISTORY — DX: Tubulo-interstitial nephritis, not specified as acute or chronic: N12

## 2021-03-22 HISTORY — DX: Personal history of other medical treatment: Z92.89

## 2021-03-22 HISTORY — DX: Other specified postprocedural states: Z98.890

## 2021-03-22 LAB — CBC
HCT: 35.5 % — ABNORMAL LOW (ref 36.0–46.0)
Hemoglobin: 11.3 g/dL — ABNORMAL LOW (ref 12.0–15.0)
MCH: 27.1 pg (ref 26.0–34.0)
MCHC: 31.8 g/dL (ref 30.0–36.0)
MCV: 85.1 fL (ref 80.0–100.0)
Platelets: 245 10*3/uL (ref 150–400)
RBC: 4.17 MIL/uL (ref 3.87–5.11)
RDW: 14.9 % (ref 11.5–15.5)
WBC: 10.7 10*3/uL — ABNORMAL HIGH (ref 4.0–10.5)
nRBC: 0 % (ref 0.0–0.2)

## 2021-03-22 LAB — TYPE AND SCREEN
ABO/RH(D): O POS
Antibody Screen: NEGATIVE

## 2021-03-22 LAB — RPR: RPR Ser Ql: NONREACTIVE

## 2021-03-22 MED ORDER — SIMETHICONE 80 MG PO CHEW: 80.0000 mg | CHEWABLE_TABLET | ORAL | Status: DC | PRN

## 2021-03-22 MED ORDER — METHYLERGONOVINE MALEATE 0.2 MG/ML IJ SOLN
0.2000 mg | Freq: Once | INTRAMUSCULAR | Status: AC
  Administered 2021-03-22: 0.2 mg via INTRAMUSCULAR

## 2021-03-22 MED ORDER — OXYCODONE-ACETAMINOPHEN 5-325 MG PO TABS: 1.0000 | ORAL_TABLET | ORAL | Status: DC | PRN

## 2021-03-22 MED ORDER — LIDOCAINE HCL (PF) 1 % IJ SOLN: 30.0000 mL | INTRAMUSCULAR | Status: DC | PRN

## 2021-03-22 MED ORDER — SOD CITRATE-CITRIC ACID 500-334 MG/5ML PO SOLN: 30.0000 mL | ORAL | Status: DC | PRN

## 2021-03-22 MED ORDER — PHENYLEPHRINE 40 MCG/ML (10ML) SYRINGE FOR IV PUSH (FOR BLOOD PRESSURE SUPPORT): 80.0000 ug | PREFILLED_SYRINGE | INTRAVENOUS | Status: DC | PRN

## 2021-03-22 MED ORDER — LACTATED RINGERS IV SOLN: INTRAVENOUS | Status: DC

## 2021-03-22 MED ORDER — OXYCODONE-ACETAMINOPHEN 5-325 MG PO TABS: 2.0000 | ORAL_TABLET | ORAL | Status: DC | PRN

## 2021-03-22 MED ORDER — ACETAMINOPHEN 325 MG PO TABS: 650.0000 mg | ORAL_TABLET | ORAL | Status: DC | PRN

## 2021-03-22 MED ORDER — MISOPROSTOL 200 MCG PO TABS
1000.0000 ug | ORAL_TABLET | Freq: Once | ORAL | Status: AC
  Administered 2021-03-22: 1000 ug via RECTAL

## 2021-03-22 MED ORDER — EPHEDRINE 5 MG/ML INJ: 10.0000 mg | INTRAVENOUS | Status: DC | PRN

## 2021-03-22 MED ORDER — METHYLERGONOVINE MALEATE 0.2 MG/ML IJ SOLN
INTRAMUSCULAR | Status: AC
  Filled 2021-03-22: qty 1

## 2021-03-22 MED ORDER — ZOLPIDEM TARTRATE 5 MG PO TABS
5.0000 mg | ORAL_TABLET | Freq: Every evening | ORAL | Status: DC | PRN
Start: 2021-03-22 — End: 2021-03-24

## 2021-03-22 MED ORDER — DIPHENHYDRAMINE HCL 25 MG PO CAPS: 25.0000 mg | ORAL_CAPSULE | Freq: Four times a day (QID) | ORAL | Status: DC | PRN

## 2021-03-22 MED ORDER — WITCH HAZEL-GLYCERIN EX PADS: 1.0000 "application " | MEDICATED_PAD | CUTANEOUS | Status: DC | PRN

## 2021-03-22 MED ORDER — COCONUT OIL OIL: 1.0000 "application " | TOPICAL_OIL | Status: DC | PRN

## 2021-03-22 MED ORDER — FENTANYL-BUPIVACAINE-NACL 0.5-0.125-0.9 MG/250ML-% EP SOLN
12.0000 mL/h | EPIDURAL | Status: DC | PRN
  Administered 2021-03-22 (×2): 12 mL/h via EPIDURAL
  Filled 2021-03-22: qty 250

## 2021-03-22 MED ORDER — ONDANSETRON HCL 4 MG/2ML IJ SOLN
4.0000 mg | Freq: Four times a day (QID) | INTRAMUSCULAR | Status: DC | PRN
  Administered 2021-03-22: 4 mg via INTRAVENOUS
  Filled 2021-03-22: qty 2

## 2021-03-22 MED ORDER — TRANEXAMIC ACID-NACL 1000-0.7 MG/100ML-% IV SOLN
INTRAVENOUS | Status: AC
  Filled 2021-03-22: qty 100

## 2021-03-22 MED ORDER — LACTATED RINGERS IV SOLN: 500.0000 mL | Freq: Once | INTRAVENOUS | Status: DC

## 2021-03-22 MED ORDER — IBUPROFEN 600 MG PO TABS
600.0000 mg | ORAL_TABLET | Freq: Four times a day (QID) | ORAL | Status: DC
  Administered 2021-03-22 – 2021-03-24 (×7): 600 mg via ORAL
  Filled 2021-03-22 (×7): qty 1

## 2021-03-22 MED ORDER — SENNOSIDES-DOCUSATE SODIUM 8.6-50 MG PO TABS
2.0000 | ORAL_TABLET | Freq: Every day | ORAL | Status: DC
  Administered 2021-03-23 – 2021-03-24 (×2): 2 via ORAL
  Filled 2021-03-22 (×2): qty 2

## 2021-03-22 MED ORDER — HYDROXYZINE HCL 50 MG PO TABS: 50.0000 mg | ORAL_TABLET | Freq: Four times a day (QID) | ORAL | Status: DC | PRN

## 2021-03-22 MED ORDER — LACTATED RINGERS IV SOLN: 500.0000 mL | INTRAVENOUS | Status: DC | PRN

## 2021-03-22 MED ORDER — LIDOCAINE-EPINEPHRINE (PF) 2 %-1:200000 IJ SOLN
INTRAMUSCULAR | Status: DC | PRN
  Administered 2021-03-22: 4 mL via EPIDURAL

## 2021-03-22 MED ORDER — TRANEXAMIC ACID-NACL 1000-0.7 MG/100ML-% IV SOLN
1000.0000 mg | INTRAVENOUS | Status: AC
  Administered 2021-03-22: 1000 mg via INTRAVENOUS

## 2021-03-22 MED ORDER — DIBUCAINE (PERIANAL) 1 % EX OINT: 1.0000 "application " | TOPICAL_OINTMENT | CUTANEOUS | Status: DC | PRN

## 2021-03-22 MED ORDER — BENZOCAINE-MENTHOL 20-0.5 % EX AERO: 1.0000 "application " | INHALATION_SPRAY | CUTANEOUS | Status: DC | PRN

## 2021-03-22 MED ORDER — ONDANSETRON HCL 4 MG PO TABS: 4.0000 mg | ORAL_TABLET | ORAL | Status: DC | PRN

## 2021-03-22 MED ORDER — DIPHENHYDRAMINE HCL 50 MG/ML IJ SOLN: 12.5000 mg | INTRAMUSCULAR | Status: DC | PRN

## 2021-03-22 MED ORDER — TERBUTALINE SULFATE 1 MG/ML IJ SOLN: 0.2500 mg | Freq: Once | INTRAMUSCULAR | Status: DC | PRN

## 2021-03-22 MED ORDER — OXYTOCIN-SODIUM CHLORIDE 30-0.9 UT/500ML-% IV SOLN
2.5000 [IU]/h | INTRAVENOUS | Status: DC
  Administered 2021-03-22: 2.5 [IU]/h via INTRAVENOUS
  Filled 2021-03-22: qty 500

## 2021-03-22 MED ORDER — OXYTOCIN-SODIUM CHLORIDE 30-0.9 UT/500ML-% IV SOLN
1.0000 m[IU]/min | INTRAVENOUS | Status: DC
  Administered 2021-03-22: 10 m[IU]/min via INTRAVENOUS

## 2021-03-22 MED ORDER — OXYTOCIN-SODIUM CHLORIDE 30-0.9 UT/500ML-% IV SOLN
1.0000 m[IU]/min | INTRAVENOUS | Status: DC
  Administered 2021-03-22: 1 m[IU]/min via INTRAVENOUS
  Filled 2021-03-22: qty 500

## 2021-03-22 MED ORDER — PRENATAL MULTIVITAMIN CH
1.0000 | ORAL_TABLET | Freq: Every day | ORAL | Status: DC
  Administered 2021-03-23: 1 via ORAL
  Filled 2021-03-22: qty 1

## 2021-03-22 MED ORDER — FENTANYL CITRATE (PF) 100 MCG/2ML IJ SOLN
50.0000 ug | INTRAMUSCULAR | Status: DC | PRN
  Administered 2021-03-22: 100 ug via INTRAVENOUS
  Filled 2021-03-22: qty 2

## 2021-03-22 MED ORDER — OXYTOCIN BOLUS FROM INFUSION
333.0000 mL | Freq: Once | INTRAVENOUS | Status: AC
  Administered 2021-03-22: 333 mL via INTRAVENOUS

## 2021-03-22 MED ORDER — ACETAMINOPHEN 325 MG PO TABS
650.0000 mg | ORAL_TABLET | ORAL | Status: DC | PRN
Start: 2021-03-22 — End: 2021-03-24
  Administered 2021-03-23 – 2021-03-24 (×2): 650 mg via ORAL
  Filled 2021-03-22 (×2): qty 2

## 2021-03-22 MED ORDER — MISOPROSTOL 200 MCG PO TABS
ORAL_TABLET | ORAL | Status: AC
  Filled 2021-03-22: qty 5

## 2021-03-22 MED ORDER — ONDANSETRON HCL 4 MG/2ML IJ SOLN: 4.0000 mg | INTRAMUSCULAR | Status: DC | PRN

## 2021-03-22 NOTE — Plan of Care (Signed)

## 2021-03-22 NOTE — Lactation Note (Signed)
This note was copied from a baby's chart. Lactation Consultation Note  Patient Name: Rachel Weiss [id number] Date: 03/22/2021   Age:29 hours  LC went in to assist Mom breastfeeding. Mom stated seen by [facility] prior to my visit.   Maternal Data    Feeding    LATCH Score Latch: Repeated attempts needed to sustain latch, nipple held in mouth throughout feeding, stimulation needed to elicit sucking reflex.  Audible Swallowing: A few with stimulation  Type of Nipple: Everted at rest and after stimulation  Comfort (Breast/Nipple): Soft / non-tender  Hold (Positioning): No assistance needed to correctly position infant at breast.  LATCH Score: 8   Lactation Tools Discussed/Used    Interventions    Discharge    Consult Status      Rachel Magid  Weiss 03/22/2021, 10:38 PM

## 2021-03-22 NOTE — Anesthesia Procedure Notes (Signed)
Epidural Patient location during procedure: OB Start time: 03/22/2021 5:30 PM End time: 03/22/2021 5:40 PM  Staffing Anesthesiologist: Elmer Picker, MD Performed: anesthesiologist   Preanesthetic Checklist Completed: patient identified, IV checked, risks and benefits discussed, monitors and equipment checked, pre-op evaluation and timeout performed  Epidural Patient position: sitting Prep: DuraPrep and site prepped and draped Patient monitoring: continuous pulse ox, blood pressure, heart rate and cardiac monitor Approach: midline Location: L3-L4 Injection technique: LOR air  Needle:  Needle type: Tuohy  Needle gauge: 17 G Needle length: 9 cm Needle insertion depth: 6 cm Catheter type: closed end flexible Catheter size: 19 Gauge Catheter at skin depth: 11 cm Test dose: negative  Assessment Sensory level: T8 Events: blood not aspirated, injection not painful, no injection resistance, no paresthesia and negative IV test  Additional Notes Patient identified. Risks/Benefits/Options discussed with patient including but not limited to bleeding, infection, nerve damage, paralysis, failed block, incomplete pain control, headache, blood pressure changes, nausea, vomiting, reactions to medication both or allergic, itching and postpartum back pain. Confirmed with bedside nurse the patient's most recent platelet count. Confirmed with patient that they are not currently taking any anticoagulation, have any bleeding history or any family history of bleeding disorders. Patient expressed understanding and wished to proceed. All questions were answered. Sterile technique was used throughout the entire procedure. Please see nursing notes for vital signs. Test dose was given through epidural catheter and negative prior to continuing to dose epidural or start infusion. Warning signs of high block given to the patient including shortness of breath, tingling/numbness in hands, complete motor block,  or any concerning symptoms with instructions to call for help. Patient was given instructions on fall risk and not to get out of bed. All questions and concerns addressed with instructions to call with any issues or inadequate analgesia.  Reason for block:procedure for pain

## 2021-03-22 NOTE — Progress Notes (Signed)
OB Progress Note  S: Patient resting comfortably.Not feeling any contractions. Desires epidural at some point. Consents to AROM and IUPC placement.  O: BP (!) 107/59   Pulse 91   Temp (!) 97.4 F (36.3 C) (Oral)   Resp 16   Ht 5\' 4"  (1.626 m)   Wt 100.8 kg   BMI 38.14 kg/m   FHT: 140bpm, moderate variablity, + accels, - decels Toco: not graphing well SVE: 5/50/-3, sutures palpated AROM: Clear, non-odorous, IUPC placed  A/P: 30 y.o. 37 @ [redacted]w[redacted]d admitted for elective IOL/TOLAC.  FWB: Cat. I Labor course: Pitocin at 65mU/min, AROM performed now Pain: Per patient request GBS: Negative Anticipate SVD  11m, DO

## 2021-03-22 NOTE — Lactation Note (Signed)
This note was copied from a baby's chart. Lactation Consultation Note  Patient Name: Girl Zenovia Justman [id number] Date: 03/22/2021   Age:29 hours  Attempted to visit with mom but L&D RN Baird Lyons reported to [facility] that mom wasn't feeling well, she was feeling sick and she'd prefer to be followed up in the [facility]. LC to come back later tonight for initial assessment.   Maternal Data    Feeding    LATCH Score Latch: Repeated attempts needed to sustain latch, nipple held in mouth throughout feeding, stimulation needed to elicit sucking reflex.  Audible Swallowing: A few with stimulation  Type of Nipple: Everted at rest and after stimulation  Comfort (Breast/Nipple): Soft / non-tender  Hold (Positioning): No assistance needed to correctly position infant at breast.  LATCH Score: 8   Lactation Tools Discussed/Used    Interventions    Discharge    Consult Status      Diera Wirkkala Venetia Constable 03/22/2021, 8:44 PM

## 2021-03-22 NOTE — Lactation Note (Signed)
This note was copied from a baby's chart. Lactation Consultation Note Mom stated she just finished BF. Asked mom if she would call for Lactation for next feeding. Mom stated OK.  Patient Name: Rachel Weiss [id number] Date: 03/22/2021   Age:29 hours  Maternal Data    Feeding    LATCH Score Latch: Repeated attempts needed to sustain latch, nipple held in mouth throughout feeding, stimulation needed to elicit sucking reflex.  Audible Swallowing: A few with stimulation  Type of Nipple: Everted at rest and after stimulation  Comfort (Breast/Nipple): Soft / non-tender  Hold (Positioning): No assistance needed to correctly position infant at breast.  LATCH Score: 8   Lactation Tools Discussed/Used    Interventions    Discharge    Consult Status      Charyl Dancer 03/22/2021, 10:12 PM

## 2021-03-22 NOTE — Anesthesia Postprocedure Evaluation (Signed)
Anesthesia Post Note  Patient: Rachel Weiss  Procedure(s) Performed: AN AD HOC LABOR EPIDURAL     Patient location during evaluation: Mother Baby Anesthesia Type: Epidural Level of consciousness: awake and alert Pain management: pain level controlled Vital Signs Assessment: post-procedure vital signs reviewed and stable Respiratory status: spontaneous breathing, nonlabored ventilation and respiratory function stable Cardiovascular status: stable Postop Assessment: no headache, no backache and epidural receding Anesthetic complications: no   No notable events documented.  Last Vitals:  Vitals:   03/22/21 1945 03/22/21 2000  BP: (!) 146/119 136/69  Pulse: (!) 112 (!) 109  Resp: 18 18  Temp:  36.8 C    Last Pain:  Vitals:   03/22/21 2000  TempSrc: Oral  PainSc: 0-No pain                 Red Mandt

## 2021-03-22 NOTE — Progress Notes (Signed)
OB Progress Note  S: Patient feeling pain with contractions. Having some pelvic pressure to. Desires to move and walk around. IUPC was previously dislodged.  O: BP 119/68   Pulse 85   Temp 97.7 F (36.5 C) (Oral)   Resp 18   Ht 5\' 4"  (1.626 m)   Wt 100.8 kg   BMI 38.14 kg/m   FHT: 145bpm, moderate variablity, + accels, - decels Toco: q1-2 minutes SVE: 7/90/-1, sutures palpated  A/P: 30 y.o. 37 @ [redacted]w[redacted]d admitted for elective IOL/TOLAC.  FWB: Cat. I Labor course: Pitocin at 35mU/min, s/p AROM at 0728 Pain: Per patient request GBS: Negative Anticipate VBAC  30m, DO

## 2021-03-22 NOTE — Anesthesia Preprocedure Evaluation (Signed)
Anesthesia Evaluation  Patient identified by MRN, date of birth, ID band Patient awake    Reviewed: Allergy & Precautions, NPO status , Patient's Chart, lab work & pertinent test results  Airway Mallampati: III  TM Distance: >3 FB Neck ROM: Full    Dental no notable dental hx.    Pulmonary neg pulmonary ROS,    Pulmonary exam normal breath sounds clear to auscultation       Cardiovascular negative cardio ROS Normal cardiovascular exam Rhythm:Regular Rate:Normal     Neuro/Psych PSYCHIATRIC DISORDERS Anxiety Depression negative neurological ROS     GI/Hepatic negative GI ROS, Neg liver ROS,   Endo/Other  negative endocrine ROSObese BMI 38  Renal/GU negative Renal ROS  negative genitourinary   Musculoskeletal negative musculoskeletal ROS (+)   Abdominal   Peds  Hematology negative hematology ROS (+)   Anesthesia Other Findings Elective IOL at 39 weeks, TOLAC  Reproductive/Obstetrics (+) Pregnancy                             Anesthesia Physical Anesthesia Plan  ASA: 2  Anesthesia Plan: Epidural   Post-op Pain Management:    Induction:   PONV Risk Score and Plan: Treatment may vary due to age or medical condition  Airway Management Planned: Natural Airway  Additional Equipment:   Intra-op Plan:   Post-operative Plan:   Informed Consent: I have reviewed the patients History and Physical, chart, labs and discussed the procedure including the risks, benefits and alternatives for the proposed anesthesia with the patient or authorized representative who has indicated his/her understanding and acceptance.       Plan Discussed with: Anesthesiologist  Anesthesia Plan Comments: (Patient identified. Risks, benefits, options discussed with patient including but not limited to bleeding, infection, nerve damage, paralysis, failed block, incomplete pain control, headache, blood pressure  changes, nausea, vomiting, reactions to medication, itching, and post partum back pain. Confirmed with bedside nurse the patient's most recent platelet count. Confirmed with the patient that they are not taking any anticoagulation, have any bleeding history or any family history of bleeding disorders. Patient expressed understanding and wishes to proceed. All questions were answered. )        Anesthesia Quick Evaluation

## 2021-03-23 LAB — CBC
HCT: 31.7 % — ABNORMAL LOW (ref 36.0–46.0)
Hemoglobin: 10.4 g/dL — ABNORMAL LOW (ref 12.0–15.0)
MCH: 27.5 pg (ref 26.0–34.0)
MCHC: 32.8 g/dL (ref 30.0–36.0)
MCV: 83.9 fL (ref 80.0–100.0)
Platelets: 204 10*3/uL (ref 150–400)
RBC: 3.78 MIL/uL — ABNORMAL LOW (ref 3.87–5.11)
RDW: 15 % (ref 11.5–15.5)
WBC: 14.8 10*3/uL — ABNORMAL HIGH (ref 4.0–10.5)
nRBC: 0 % (ref 0.0–0.2)

## 2021-03-23 MED ORDER — IBUPROFEN 800 MG PO TABS: 800.0000 mg | ORAL_TABLET | Freq: Three times a day (TID) | ORAL | 0 refills | Status: DC | PRN

## 2021-03-23 MED ORDER — COVID-19 MRNA VAC-TRIS(PFIZER) 30 MCG/0.3ML IM SUSP
0.3000 mL | Freq: Once | INTRAMUSCULAR | Status: AC
  Administered 2021-03-23: 0.3 mL via INTRAMUSCULAR
  Filled 2021-03-23: qty 0.3

## 2021-03-23 NOTE — Anesthesia Postprocedure Evaluation (Signed)
Anesthesia Post Note  Patient: ALEXYA MCDARIS  Procedure(s) Performed: AN AD HOC LABOR EPIDURAL     Patient location during evaluation: Mother Baby Anesthesia Type: Epidural Level of consciousness: awake and alert Pain management: pain level controlled Vital Signs Assessment: post-procedure vital signs reviewed and stable Respiratory status: spontaneous breathing, nonlabored ventilation and respiratory function stable Cardiovascular status: stable Postop Assessment: no headache, no backache and epidural receding Anesthetic complications: no   No notable events documented.  Last Vitals:  Vitals:   03/23/21 0300 03/23/21 0735  BP: 106/67 108/71  Pulse: 92 77  Resp: 18 18  Temp: 36.8 C 36.7 C  SpO2: 98% 99%    Last Pain:  Vitals:   03/23/21 0735  TempSrc: Oral  PainSc:    Pain Goal:                   Essica Kiker

## 2021-03-23 NOTE — Progress Notes (Signed)
Postpartum Note Day #1  S:  Patient doing well.  Pain controlled.  Tolerating regular diet. Ambulating and voiding without difficulty. Denies fevers, chills, chest pain, SOB, N/V, or worsening bilateral LE edema.  Lochia: Minimal Infant feeding:  Breast Circumcision:  N/A, female infant Contraception:  Desires interval bilateral salpingectomy  O: Temp:  [97.7 F (36.5 C)-100.1 F (37.8 C)] 98.3 F (36.8 C) (06/23 0300) Pulse Rate:  [84-112] 92 (06/23 0300) Resp:  [18] 18 (06/23 0300) BP: (100-146)/(48-119) 106/67 (06/23 0300) SpO2:  [96 %-99 %] 98 % (06/23 0300) Gen: NAD, pleasant and cooperative CV: RRR Resp: CTAB, no wheezes/rales/rhonchi Abdomen: soft, non-distended, non-tender throughout Uterus: firm, non-tender, below umbilicus Ext: No bilateral LE edema, no bilateral calf tenderness  Labs:  Recent Labs    03/22/21 0035 03/23/21 0519  HGB 11.3* 10.4*    A/P: Patient is a 30 y.o. [id number] PPD#1 s/p VBAC.  - Pain well controlled  - GU: UOP is adequate - GI: Tolerating regular diet - Activity: encouraged sitting up to chair and ambulation as tolerated - DVT Prophylaxis: Frequent ambulation, SCDs in bed - Labs: as above  Disposition:  D/C home PPD#2   Steva Ready, DO [phone] (office)

## 2021-03-23 NOTE — Lactation Note (Addendum)
This note was copied from a baby's chart. Lactation Consultation Note  Patient Name: Rachel Weiss JIRCV'E Date: 03/23/2021 Reason for consult: Follow-up assessment;Term Age:29 hours  Initial visit to 24 hours old infant of a P3 mother. Mother latches independently. Infant is still breastfeeding after ~13 minutes. Mother has personal pump and inquires about creating own milk bank. LC submitting WIC referral per mother's request.  Plan: 1-Skin to skin 2-Aim for a deep, comfortable latch 3-Breastfeeding on demand or 8-12 times in 24h period. 4-Keep infant awake during breastfeeding session: massaging breast, infant's hand/shoulder/feet 5-Monitor voids and stools as signs good intake.  6-Encouraged maternal rest, hydration and food intake.  7-Contact LC as needed for feeds/support/concerns/questions   All questions answered at this time. Provided Lactation services brochure and promoted [employer] booklet information. tea   Maternal Data Has patient been taught Hand Expression?: No Does the patient have breastfeeding experience prior to this delivery?: Yes How long did the patient breastfeed?: 12 months- firstchild; 10 months- second child  Feeding Mother's Current Feeding Choice: Breast Milk  LATCH Score Latch: Grasps breast easily, tongue down, lips flanged, rhythmical sucking.  Audible Swallowing: Spontaneous and intermittent  Type of Nipple: Everted at rest and after stimulation  Comfort (Breast/Nipple): Soft / non-tender  Hold (Positioning): No assistance needed to correctly position infant at breast.  LATCH Score: 10  Interventions Interventions: Breast feeding basics reviewed;Assisted with latch;Skin to skin;DEBP;Expressed milk;Education  Discharge Pump: Personal WIC Program: No  Consult Status Consult Status: Follow-up Date: 03/24/21 Follow-up type: In-patient    Rachel Weiss 03/23/2021, 8:19 PM

## 2021-03-23 NOTE — Social Work (Signed)
CSW received consult for hx of Anxiety and Depression.  CSW met with MOB to offer support and complete assessment.     CSW introduced self and role. CSW observed infant in bassinet sleeping and FOB Travis on couch. MOB provided permission to complete assessment with FOB present. CSW informed MOB of reason for consult. MOB was receptive to visit and understanding. MOB reported she experienced anxiety and postpartum depression with both of her previous two children. MOB shared the symptoms presented as crying a lot, being short tempered and easily overwhelmed. CSW asked when the symptoms presented. MOB shared the symptoms developed at random for each postpartum period. CSW asked MOB how she coped with the symptoms. MOB stated she pushed through following her first child and was prescribed Lexapro when it was experienced with her second child. MOB reported the Lexapro was helpful. MOB stated she never attended therapy to treat the symptoms. CSW asked MOB if she would feel comfortable speaking with a professional if needs arise this time around, MOB stated yes. CSW inquired on MOB supports system. MOB identified FOB and her mother as her primary supports. CSW assessed MOB current emotions. MOB reported she is doing well and denies any current mental health concerns. MOB denies any current SI or HI.   CSW provided education regarding the baby blues period versus PPD and provided resources. CSW provided the New Mom Checklist and encouraged MOB to self evaluate and contact a medical professional if symptoms are noted at any time.   CSW provided review of Sudden Infant Death Syndrome (SIDS) precautions.  MOB reported she has all essentials for infant. MOB identified [facility] for follow-up care and denies any barriers to care. MOB reported she has no additional needs at this time.  CSW identifies no further need for intervention and no barriers to discharge at this time.  Talon Witting,  LCSWA Clinical Social Work [facility] [phone] 

## 2021-03-23 NOTE — Addendum Note (Signed)
Addendum  created 03/23/21 0811 by Renford Dills, CRNA   Clinical Note Signed

## 2021-03-23 NOTE — Lactation Note (Signed)
This note was copied from a baby's chart. Lactation Consultation Note Attempted to see mom. Parents sleeping. Patient Name: Rachel Weiss [id number] Date: 03/23/2021   Age:29 hours  Maternal Data    Feeding    LATCH Score Latch: Repeated attempts needed to sustain latch, nipple held in mouth throughout feeding, stimulation needed to elicit sucking reflex.  Audible Swallowing: A few with stimulation  Type of Nipple: Everted at rest and after stimulation  Comfort (Breast/Nipple): Soft / non-tender  Hold (Positioning): No assistance needed to correctly position infant at breast.  LATCH Score: 8   Lactation Tools Discussed/Used    Interventions    Discharge    Consult Status      Charyl Dancer 03/23/2021, 2:41 AM

## 2021-03-24 NOTE — Lactation Note (Signed)
This note was copied from a baby's chart. Lactation Consultation Note  Patient Name: Girl Noam Franzen [id number]'X Date: 03/24/2021 Reason for consult: Follow-up assessment;Term;Hyperbilirubinemia (No longer on billi lights for jaundice.) Age:29 hours, -5% weight loss. Per mom, infant had one void today at 6 am P3, per mom, infant is latching well, infant recently BF for 15 minutes at 1515 pm. Mom has no questions or  concerns for LC at this time. LC suggested mom can hand express after latching infant at breast to give back extra volume of EBM by spoon.  LC discussed continue to BF infant according to cues, per mom, infant  is currently cluster feeding.  LC discussed infant's input and output. LC discussed engorgement prevention and treatment.  Mom made aware of O/P services, breastfeeding support groups, community resources, and our phone # for post-discharge questions.   Maternal Data    Feeding Mother's Current Feeding Choice: Breast Milk  LATCH Score                    Lactation Tools Discussed/Used    Interventions Interventions: Hand express;Breast compression;Education  Discharge Discharge Education: Warning signs for feeding baby;Engorgement and breast care;Other (comment) (Per mom, she has appointment schedule tomorrow at [facility].)  Consult Status Consult Status: Complete Date: 03/25/21 Follow-up type: Physician    Danelle Earthly 03/24/2021, 3:43 PM

## 2021-03-24 NOTE — Discharge Summary (Signed)
Postpartum Discharge Summary  Date of Service updated 03/24/2021     Patient Name: Rachel Weiss DOB: November 03, 1990 MRN: [id number]  Date of admission: 03/10/2021 Delivery date:03/22/2021  Delivering provider: Drema Dallas  Date of discharge: 03/24/2021  Admitting diagnosis: Pyelonephritis affecting pregnancy [O23.00] / Term pregnancy / history of cesarean section complicating pregnancy  Intrauterine pregnancy: [redacted]w[redacted]d    Secondary diagnosis:  Active Problems:   Pyelonephritis affecting pregnancy  Additional problems: None    Discharge diagnosis: VBAC                                              Post partum procedures: None Augmentation: AROM and Pitocin Complications: None  Hospital course: Induction of Labor With Vaginal Delivery   30y.o. yo [id number] admitted to the hospital 03/10/2021 for induction of labor.  Indication for induction: Favorable cervix at term.  Patient had an uncomplicated labor course as follows: Membrane Rupture Time/Date: 7:28 AM ,03/22/2021   Delivery Method:Vaginal, Spontaneous  Episiotomy: None  Lacerations:  None  Details of delivery can be found in separate delivery note.  Patient had a routine postpartum course. Patient is discharged home 03/24/21.  Newborn Data: Birth date:03/22/2021  Birth time:7:38 PM  Gender:Female  Living status:Living  Apgars:8 ,9  Weight:3685 g   Magnesium Sulfate received: No BMZ received: No Rhophylac:N/A MMR:N/A T-DaP:Given prenatally Flu: N/A Transfusion:No  Physical exam  Vitals:   03/11/21 2235 03/12/21 0657 03/12/21 1202 03/12/21 1203  BP: (!) 96/52 (!) 104/59  126/82  Pulse: (!) 115 (!) 110  (!) 104  Resp: 16     Temp: 98.2 F (36.8 C)  97.8 F (36.6 C)   TempSrc: Oral  Oral   SpO2: 98% 98% 99%   Weight:      Height:       General: alert, cooperative, and no distress Lochia: appropriate Uterine Fundus: firm Incision: N/A DVT Evaluation: No evidence of DVT seen on physical  exam. Labs: Lab Results  Component Value Date   WBC 14.8 (H) 03/23/2021   HGB 10.4 (L) 03/23/2021   HCT 31.7 (L) 03/23/2021   MCV 83.9 03/23/2021   PLT 204 03/23/2021   CMP Latest Ref Rng & Units 03/10/2021  Glucose 70 - 99 mg/dL 121(H)  BUN 6 - 20 mg/dL 8  Creatinine 0.44 - 1.00 mg/dL 0.74  Sodium 135 - 145 mmol/L 134(L)  Potassium 3.5 - 5.1 mmol/L 3.4(L)  Chloride 98 - 111 mmol/L 106  CO2 22 - 32 mmol/L 17(L)  Calcium 8.9 - 10.3 mg/dL 9.0  Total Protein 6.5 - 8.1 g/dL 6.4(L)  Total Bilirubin 0.3 - 1.2 mg/dL 0.8  Alkaline Phos 38 - 126 U/L 131(H)  AST 15 - 41 U/L 16  ALT 0 - 44 U/L 15   Edinburgh Score: Edinburgh Postnatal Depression Scale Screening Tool 03/23/2021  I have been able to laugh and see the funny side of things. 0  I have looked forward with enjoyment to things. 0  I have blamed myself unnecessarily when things went wrong. 1  I have been anxious or worried for no good reason. 0  I have felt scared or panicky for no good reason. 0  Things have been getting on top of me. 1  I have been so unhappy that I have had difficulty sleeping. 0  I have felt sad  or miserable. 1  I have been so unhappy that I have been crying. 1  The thought of harming myself has occurred to me. 0  Edinburgh Postnatal Depression Scale Total 4      After visit meds:  Allergies as of 03/12/2021       Reactions   Azithromycin    Doxycycline         Medication List     TAKE these medications    cephALEXin 500 MG capsule Commonly known as: KEFLEX Take 1 capsule (500 mg total) by mouth 4 (four) times daily for 14 days.         Discharge home in stable condition Infant Feeding: Breast Infant Disposition:home with mother Discharge instruction: per After Visit Summary and Postpartum booklet. Activity: Advance as tolerated. Pelvic rest for 6 weeks.  Diet: routine diet Anticipated Birth Control: Unsure Postpartum Appointment:6 weeks Additional Postpartum F/U:  None Future  Appointments:No future appointments. Follow up Visit:  Follow-up Information     Gynecology, [facility] Schedule an appointment as soon as possible for a visit.   Specialty: Obstetrics and Gynecology Why: Pt verbilized she will call Monday morning to speak with DR Delora Fuel for follow up this week, IOL set for June 22 Contact information: [place] [address] [phone]                     03/24/2021 Christophe Louis, MD

## 2021-03-30 NOTE — Discharge Summary (Signed)
Postpartum Discharge Summary  Date of Service updated 03/24/2021     Patient Name: Rachel Weiss DOB: May 13, 1991 MRN: [id number]  Date of admission: 03/22/2021 Delivery date:03/22/2021  Delivering provider: Drema Dallas  Date of discharge: 03/30/2021  Admitting diagnosis: Encounter for elective induction of labor [Z34.90] Intrauterine pregnancy: [redacted]w[redacted]d     Secondary diagnosis:  Active Problems:   Obesity (BMI 30-39.9)   History of cesarean section   Anxiety and depression   History of postpartum hemorrhage   History of loop electrical excision procedure (LEEP)   History of blood transfusion   Encounter for elective induction of labor  Additional problems: None    Discharge diagnosis: VBAC                                              Post partum procedures: None Augmentation: AROM and Pitocin Complications: None  Hospital course: Induction of Labor With Vaginal Delivery   30 y.o. yo [id number] at [redacted]w[redacted]d was admitted to the hospital 03/22/2021 for induction of labor.  Indication for induction: Favorable cervix at term.  Patient had an uncomplicated labor course as follows: Membrane Rupture Time/Date: 7:28 AM ,03/22/2021   Delivery Method:Vaginal, Spontaneous  Episiotomy: None  Lacerations:  None  Details of delivery can be found in separate delivery note.  Patient had a routine postpartum course. Patient is discharged home 03/30/21.  Newborn Data: Birth date:03/22/2021  Birth time:7:38 PM  Gender:Female  Living status:Living  Apgars:8 ,9  Weight:3.685 kg   Magnesium Sulfate received: No BMZ received: No Rhophylac:N/A MMR:N/A T-DaP:Given prenatally Flu: N/A Transfusion:No  Physical exam  Vitals:   03/23/21 1130 03/23/21 1353 03/23/21 2118 03/24/21 0548  BP: 110/67 108/66 109/73 110/69  Pulse: 76 68 67 80  Resp: $Remo'18 16 16 18  'OYkFI$ Temp: 97.7 F (36.5 C) 98.4 F (36.9 C) 97.7 F (36.5 C) 97.9 F (36.6 C)  TempSrc: Oral Oral Oral Oral  SpO2: 99% 100% 99% 99%   Weight:      Height:       General: alert, cooperative, and no distress Lochia: appropriate Uterine Fundus: firm Incision: N/A DVT Evaluation: No evidence of DVT seen on physical exam. Labs: Lab Results  Component Value Date   WBC 14.8 (H) 03/23/2021   HGB 10.4 (L) 03/23/2021   HCT 31.7 (L) 03/23/2021   MCV 83.9 03/23/2021   PLT 204 03/23/2021   CMP Latest Ref Rng & Units 03/10/2021  Glucose 70 - 99 mg/dL 121(H)  BUN 6 - 20 mg/dL 8  Creatinine 0.44 - 1.00 mg/dL 0.74  Sodium 135 - 145 mmol/L 134(L)  Potassium 3.5 - 5.1 mmol/L 3.4(L)  Chloride 98 - 111 mmol/L 106  CO2 22 - 32 mmol/L 17(L)  Calcium 8.9 - 10.3 mg/dL 9.0  Total Protein 6.5 - 8.1 g/dL 6.4(L)  Total Bilirubin 0.3 - 1.2 mg/dL 0.8  Alkaline Phos 38 - 126 U/L 131(H)  AST 15 - 41 U/L 16  ALT 0 - 44 U/L 15   Edinburgh Score: Edinburgh Postnatal Depression Scale Screening Tool 03/23/2021  I have been able to laugh and see the funny side of things. 0  I have looked forward with enjoyment to things. 0  I have blamed myself unnecessarily when things went wrong. 1  I have been anxious or worried for no good reason. 0  I have felt scared or panicky  for no good reason. 0  Things have been getting on top of me. 1  I have been so unhappy that I have had difficulty sleeping. 0  I have felt sad or miserable. 1  I have been so unhappy that I have been crying. 1  The thought of harming myself has occurred to me. 0  Edinburgh Postnatal Depression Scale Total 4      After visit meds:  Allergies as of 03/24/2021       Reactions   Azithromycin Rash   Doxycycline Rash        Medication List     TAKE these medications    ibuprofen 800 MG tablet Commonly known as: ADVIL Take 1 tablet (800 mg total) by mouth every 8 (eight) hours as needed.   prenatal multivitamin Tabs tablet Take 1 tablet by mouth daily at 12 noon.       ASK your doctor about these medications    cephALEXin 500 MG capsule Commonly known  as: KEFLEX Take 1 capsule (500 mg total) by mouth 4 (four) times daily for 14 days. Ask about: Should I take this medication?         Discharge home in stable condition Infant Feeding: Breast Infant Disposition:home with mother Discharge instruction: per After Visit Summary and Postpartum booklet. Activity: Advance as tolerated. Pelvic rest for 6 weeks.  Diet: routine diet Anticipated Birth Control: Unsure Postpartum Appointment:6 weeks Additional Postpartum F/U:  [facility]One Future Appointments:No future appointments. Follow up Visit:  Follow-up Information     Drema Dallas, DO Follow up in 6 week(s).   Specialty: Obstetrics and Gynecology Why: Please keep your 6 week postpartum visit with Dr. Delora Fuel. Contact information: [address] [place] [address] [phone]                     03/30/2021 Christophe Louis, MD

## 2021-04-04 ENCOUNTER — Telehealth (HOSPITAL_COMMUNITY): Payer: Self-pay | Admitting: *Deleted

## 2021-04-04 NOTE — Telephone Encounter (Signed)
Mom reports she's doing fine. No concerns about herself. Mom reports baby is doing well. Feeding, peeing, and pooping well. No concerns about baby. EPDS = 0

## 2021-05-05 DIAGNOSIS — M549 Dorsalgia, unspecified: Secondary | ICD-10-CM | POA: Diagnosis not present

## 2021-05-05 DIAGNOSIS — Z01818 Encounter for other preprocedural examination: Secondary | ICD-10-CM | POA: Diagnosis not present

## 2021-05-05 DIAGNOSIS — Z124 Encounter for screening for malignant neoplasm of cervix: Secondary | ICD-10-CM | POA: Diagnosis not present

## 2021-05-05 DIAGNOSIS — Z3009 Encounter for other general counseling and advice on contraception: Secondary | ICD-10-CM | POA: Diagnosis not present

## 2021-05-08 NOTE — H&P (Signed)
Rachel Weiss is an 30 y.o. [phone] presenting for Laparoscopic bilateral salpingectomy for desire for permanent sterilization.  She has completed her family and does not desire future fertility. Patient declines all forms of hormonal contraceptives and vasectomy. She has had a recent VBAC on 03/22/2021.  Patient Active Problem List   Diagnosis Date Noted   Encounter for elective induction of labor 03/22/2021   Obesity (BMI 30-39.9) 03/15/2021   History of cesarean section 03/15/2021   Anxiety and depression 03/15/2021   History of postpartum hemorrhage 03/15/2021   History of loop electrical excision procedure (LEEP) 03/15/2021   History of blood transfusion 03/15/2021   Pyelonephritis affecting pregnancy 03/10/2021    MEDICAL/FAMILY/SOCIAL HX: No LMP recorded.    Past Medical History:  Diagnosis Date   H/O LEEP    History of blood transfusion    Medical history non-contributory    Pyelonephritis     Past Surgical History:  Procedure Laterality Date   CESAREAN SECTION      No family history on file.  Social History:  reports that she has never smoked. She has never used smokeless tobacco. She reports previous alcohol use. She reports that she does not use drugs.  ALLERGIES/MEDS:  Allergies:  Allergies  Allergen Reactions   Azithromycin Rash   Doxycycline Rash    No medications prior to admission.     Review of Systems  Constitutional: Negative.   HENT: Negative.    Eyes: Negative.   Respiratory: Negative.    Cardiovascular: Negative.   Gastrointestinal: Negative.   Genitourinary: Negative.   Musculoskeletal: Negative.   Skin: Negative.   Neurological: Negative.   Endo/Heme/Allergies: Negative.   Psychiatric/Behavioral: Negative.     unknown if currently breastfeeding. Gen:  NAD, pleasant and cooperative Cardio:  RRR Pulm:  CTAB, no wheezes/rales/rhonchi Abd:  Soft, non-distended, non-tender throughout, no rebound/guarding Ext:  No bilateral LE  edema, no bilateral calf tenderness Pelvic: Normal-appearing external genitalia, vaginal mucosa pink with rugae, cervix - normal and without lesions  No results found for this or any previous visit (from the past 24 hour(s)).  No results found.   ASSESSMENT/PLAN: [name] is a 30 y.o. [phone] who is admitted for Laparoscopic bilateral salpingectomy for desire for permanent sterilization.  - Admit to [facility] - Admit labs (CBC, T&S, COVID screen) - Diet: NPO - IVF:  Per anesthesia - VTE Prophylaxis:  SCDs - Antibiotics: None  Consents: Patient was counseled on the risks, benefits and alternatives of bilateral tubal sterilization.  Like any surgery this procedure carries risks including to but not limited to infection, damage to surrounding structures requiring additional surgery, thromboembolism, and even death.  She understands that there is a failure rate of approximently less than 1%, and if she were to get pregnant after a bilateral tubal sterilization, there is a possibility of ectopic pregnancy.  She was advised that if she should miss her period andor have a positive pregnancy test after her bilateral tubal sterilization, she should seek medical assistance.  She is aware that this is a permanent procedure. Patient was consented for blood products.  The patient is aware that bleeding may result in the need for a blood transfusion which includes risk of transmission of HIV (1:2 million), Hepatitis C (1:2 million), and Hepatitis B (1:200 thousand) and transfusion reaction.  Patient voiced understanding of the above risks as well as understanding of indications for blood transfusion.   Steva Ready, DO [phone] (office)

## 2021-06-12 ENCOUNTER — Other Ambulatory Visit: Payer: Self-pay

## 2021-06-12 ENCOUNTER — Encounter (HOSPITAL_BASED_OUTPATIENT_CLINIC_OR_DEPARTMENT_OTHER): Payer: Self-pay | Admitting: Obstetrics and Gynecology

## 2021-06-19 ENCOUNTER — Encounter (HOSPITAL_BASED_OUTPATIENT_CLINIC_OR_DEPARTMENT_OTHER)
Admission: RE | Admit: 2021-06-19 | Discharge: 2021-06-19 | Disposition: A | Discharge status: Home / Self Care | Payer: 59 | Source: Ambulatory Visit | Attending: Obstetrics and Gynecology | Admitting: Obstetrics and Gynecology

## 2021-06-19 DIAGNOSIS — Z881 Allergy status to other antibiotic agents status: Secondary | ICD-10-CM | POA: Diagnosis not present

## 2021-06-19 DIAGNOSIS — N838 Other noninflammatory disorders of ovary, fallopian tube and broad ligament: Secondary | ICD-10-CM | POA: Diagnosis not present

## 2021-06-19 DIAGNOSIS — E669 Obesity, unspecified: Secondary | ICD-10-CM | POA: Diagnosis not present

## 2021-06-19 DIAGNOSIS — Z01812 Encounter for preprocedural laboratory examination: Secondary | ICD-10-CM | POA: Insufficient documentation

## 2021-06-19 DIAGNOSIS — Z302 Encounter for sterilization: Secondary | ICD-10-CM | POA: Diagnosis not present

## 2021-06-19 DIAGNOSIS — Z8759 Personal history of other complications of pregnancy, childbirth and the puerperium: Secondary | ICD-10-CM | POA: Diagnosis not present

## 2021-06-19 DIAGNOSIS — Z6830 Body mass index (BMI) 30.0-30.9, adult: Secondary | ICD-10-CM | POA: Diagnosis not present

## 2021-06-19 DIAGNOSIS — Z98891 History of uterine scar from previous surgery: Secondary | ICD-10-CM | POA: Diagnosis not present

## 2021-06-19 LAB — CBC
HCT: 39.9 % (ref 36.0–46.0)
Hemoglobin: 12.9 g/dL (ref 12.0–15.0)
MCH: 26.2 pg (ref 26.0–34.0)
MCHC: 32.3 g/dL (ref 30.0–36.0)
MCV: 81.1 fL (ref 80.0–100.0)
Platelets: 276 10*3/uL (ref 150–400)
RBC: 4.92 MIL/uL (ref 3.87–5.11)
RDW: 14 % (ref 11.5–15.5)
WBC: 4.3 10*3/uL (ref 4.0–10.5)
nRBC: 0 % (ref 0.0–0.2)

## 2021-06-19 LAB — POCT PREGNANCY, URINE: Preg Test, Ur: NEGATIVE

## 2021-06-20 LAB — RPR: RPR Ser Ql: NONREACTIVE

## 2021-06-20 NOTE — Anesthesia Preprocedure Evaluation (Addendum)
Anesthesia Evaluation  Patient identified by MRN, date of birth, ID band Patient awake    Reviewed: Allergy & Precautions, H&P , NPO status , Patient's Chart, lab work & pertinent test results  Airway Mallampati: II  TM Distance: >3 FB Neck ROM: Full    Dental no notable dental hx. (+) Teeth Intact, Dental Advisory Given   Pulmonary neg pulmonary ROS,    Pulmonary exam normal breath sounds clear to auscultation       Cardiovascular Exercise Tolerance: Good negative cardio ROS Normal cardiovascular exam Rhythm:Regular Rate:Normal     Neuro/Psych PSYCHIATRIC DISORDERS Anxiety Depression negative neurological ROS  negative psych ROS   GI/Hepatic negative GI ROS, Neg liver ROS,   Endo/Other  negative endocrine ROS  Renal/GU negative Renal ROS  negative genitourinary   Musculoskeletal negative musculoskeletal ROS (+)   Abdominal   Peds negative pediatric ROS (+)  Hematology negative hematology ROS (+)   Anesthesia Other Findings   Reproductive/Obstetrics negative OB ROS                            Anesthesia Physical Anesthesia Plan  ASA: 2  Anesthesia Plan: General   Post-op Pain Management:    Induction: Intravenous  PONV Risk Score and Plan: 3 and Ondansetron and Dexamethasone  Airway Management Planned: LMA and Oral ETT  Additional Equipment: None  Intra-op Plan:   Post-operative Plan: Extubation in OR  Informed Consent: I have reviewed the patients History and Physical, chart, labs and discussed the procedure including the risks, benefits and alternatives for the proposed anesthesia with the patient or authorized representative who has indicated his/her understanding and acceptance.       Plan Discussed with: CRNA and Anesthesiologist  Anesthesia Plan Comments: ( )        Anesthesia Quick Evaluation

## 2021-06-21 ENCOUNTER — Ambulatory Visit (HOSPITAL_BASED_OUTPATIENT_CLINIC_OR_DEPARTMENT_OTHER): Payer: 59 | Admitting: Anesthesiology

## 2021-06-21 ENCOUNTER — Other Ambulatory Visit: Payer: Self-pay

## 2021-06-21 ENCOUNTER — Ambulatory Visit (HOSPITAL_BASED_OUTPATIENT_CLINIC_OR_DEPARTMENT_OTHER)
Admission: RE | Admit: 2021-06-21 | Discharge: 2021-06-21 | Disposition: A | Discharge status: Home / Self Care | Payer: 59 | Attending: Obstetrics and Gynecology | Admitting: Obstetrics and Gynecology

## 2021-06-21 ENCOUNTER — Encounter (HOSPITAL_BASED_OUTPATIENT_CLINIC_OR_DEPARTMENT_OTHER)
Admission: RE | Disposition: A | Discharge status: Home / Self Care | Payer: Self-pay | Source: Home / Self Care | Attending: Obstetrics and Gynecology

## 2021-06-21 ENCOUNTER — Encounter (HOSPITAL_BASED_OUTPATIENT_CLINIC_OR_DEPARTMENT_OTHER): Payer: Self-pay | Admitting: Obstetrics and Gynecology

## 2021-06-21 DIAGNOSIS — N838 Other noninflammatory disorders of ovary, fallopian tube and broad ligament: Secondary | ICD-10-CM | POA: Diagnosis not present

## 2021-06-21 DIAGNOSIS — Z881 Allergy status to other antibiotic agents status: Secondary | ICD-10-CM | POA: Diagnosis not present

## 2021-06-21 DIAGNOSIS — Z302 Encounter for sterilization: Secondary | ICD-10-CM | POA: Insufficient documentation

## 2021-06-21 DIAGNOSIS — E669 Obesity, unspecified: Secondary | ICD-10-CM | POA: Insufficient documentation

## 2021-06-21 DIAGNOSIS — Z6830 Body mass index (BMI) 30.0-30.9, adult: Secondary | ICD-10-CM | POA: Insufficient documentation

## 2021-06-21 DIAGNOSIS — Z8759 Personal history of other complications of pregnancy, childbirth and the puerperium: Secondary | ICD-10-CM | POA: Insufficient documentation

## 2021-06-21 DIAGNOSIS — Z98891 History of uterine scar from previous surgery: Secondary | ICD-10-CM | POA: Insufficient documentation

## 2021-06-21 HISTORY — PX: LAPAROSCOPIC BILATERAL SALPINGECTOMY: SHX5889

## 2021-06-21 SURGERY — SALPINGECTOMY, BILATERAL, LAPAROSCOPIC
Anesthesia: General | Site: Abdomen | Laterality: Bilateral

## 2021-06-21 MED ORDER — OXYCODONE HCL 5 MG PO TABS: 5.0000 mg | ORAL_TABLET | ORAL | 0 refills | Status: AC | PRN

## 2021-06-21 MED ORDER — KETOROLAC TROMETHAMINE 30 MG/ML IJ SOLN
INTRAMUSCULAR | Status: DC | PRN
  Administered 2021-06-21: 30 mg via INTRAVENOUS

## 2021-06-21 MED ORDER — FENTANYL CITRATE (PF) 100 MCG/2ML IJ SOLN
INTRAMUSCULAR | Status: AC
  Filled 2021-06-21: qty 2

## 2021-06-21 MED ORDER — SUGAMMADEX SODIUM 200 MG/2ML IV SOLN
INTRAVENOUS | Status: DC | PRN
  Administered 2021-06-21: 200 mg via INTRAVENOUS

## 2021-06-21 MED ORDER — LIDOCAINE HCL (CARDIAC) PF 100 MG/5ML IV SOSY
PREFILLED_SYRINGE | INTRAVENOUS | Status: DC | PRN
  Administered 2021-06-21: 60 mg via INTRAVENOUS

## 2021-06-21 MED ORDER — DEXAMETHASONE SODIUM PHOSPHATE 4 MG/ML IJ SOLN
INTRAMUSCULAR | Status: DC | PRN
  Administered 2021-06-21: 5 mg via INTRAVENOUS

## 2021-06-21 MED ORDER — IBUPROFEN 800 MG PO TABS: 800.0000 mg | ORAL_TABLET | Freq: Three times a day (TID) | ORAL | 0 refills | Status: AC | PRN

## 2021-06-21 MED ORDER — ACETAMINOPHEN 160 MG/5ML PO SOLN: 325.0000 mg | ORAL | Status: DC | PRN

## 2021-06-21 MED ORDER — PROPOFOL 10 MG/ML IV BOLUS
INTRAVENOUS | Status: DC | PRN
  Administered 2021-06-21: 200 mg via INTRAVENOUS

## 2021-06-21 MED ORDER — FENTANYL CITRATE (PF) 100 MCG/2ML IJ SOLN
25.0000 ug | INTRAMUSCULAR | Status: DC | PRN
  Administered 2021-06-21 (×2): 50 ug via INTRAVENOUS

## 2021-06-21 MED ORDER — OXYCODONE HCL 5 MG/5ML PO SOLN: 5.0000 mg | Freq: Once | ORAL | Status: AC | PRN

## 2021-06-21 MED ORDER — BUPIVACAINE HCL (PF) 0.25 % IJ SOLN
INTRAMUSCULAR | Status: DC | PRN
  Administered 2021-06-21: 30 mL

## 2021-06-21 MED ORDER — OXYCODONE HCL 5 MG PO TABS
ORAL_TABLET | ORAL | Status: AC
  Filled 2021-06-21: qty 1

## 2021-06-21 MED ORDER — ONDANSETRON HCL 4 MG/2ML IJ SOLN: 4.0000 mg | Freq: Once | INTRAMUSCULAR | Status: DC | PRN

## 2021-06-21 MED ORDER — DEXMEDETOMIDINE (PRECEDEX) IN NS 20 MCG/5ML (4 MCG/ML) IV SYRINGE
PREFILLED_SYRINGE | INTRAVENOUS | Status: DC | PRN
  Administered 2021-06-21: 16 ug via INTRAVENOUS

## 2021-06-21 MED ORDER — MEPERIDINE HCL 25 MG/ML IJ SOLN: 6.2500 mg | INTRAMUSCULAR | Status: DC | PRN

## 2021-06-21 MED ORDER — OXYCODONE HCL 5 MG PO TABS
5.0000 mg | ORAL_TABLET | Freq: Once | ORAL | Status: AC | PRN
  Administered 2021-06-21: 5 mg via ORAL

## 2021-06-21 MED ORDER — ROCURONIUM BROMIDE 100 MG/10ML IV SOLN
INTRAVENOUS | Status: DC | PRN
  Administered 2021-06-21: 60 mg via INTRAVENOUS

## 2021-06-21 MED ORDER — LACTATED RINGERS IV SOLN: INTRAVENOUS | Status: DC

## 2021-06-21 MED ORDER — ONDANSETRON HCL 4 MG/2ML IJ SOLN
INTRAMUSCULAR | Status: DC | PRN
  Administered 2021-06-21: 4 mg via INTRAVENOUS

## 2021-06-21 MED ORDER — MIDAZOLAM HCL 5 MG/5ML IJ SOLN
INTRAMUSCULAR | Status: DC | PRN
  Administered 2021-06-21: 2 mg via INTRAVENOUS

## 2021-06-21 MED ORDER — EPHEDRINE SULFATE 50 MG/ML IJ SOLN
INTRAMUSCULAR | Status: DC | PRN
  Administered 2021-06-21: 20 mg via INTRAVENOUS

## 2021-06-21 MED ORDER — FENTANYL CITRATE (PF) 100 MCG/2ML IJ SOLN
INTRAMUSCULAR | Status: DC | PRN
  Administered 2021-06-21 (×2): 50 ug via INTRAVENOUS

## 2021-06-21 MED ORDER — ACETAMINOPHEN 325 MG PO TABS: 325.0000 mg | ORAL_TABLET | ORAL | Status: DC | PRN

## 2021-06-21 MED ORDER — MIDAZOLAM HCL 2 MG/2ML IJ SOLN
INTRAMUSCULAR | Status: AC
  Filled 2021-06-21: qty 2

## 2021-06-21 SURGICAL SUPPLY — 26 items
ADH SKN CLS APL DERMABOND .7 (GAUZE/BANDAGES/DRESSINGS) ×1
DERMABOND ADVANCED (GAUZE/BANDAGES/DRESSINGS) ×1
DERMABOND ADVANCED .7 DNX12 (GAUZE/BANDAGES/DRESSINGS) ×1 IMPLANT
DURAPREP 26ML APPLICATOR (WOUND CARE) ×2 IMPLANT
GAUZE 4X4 16PLY ~~LOC~~+RFID DBL (SPONGE) ×2 IMPLANT
GLOVE SURG ENC MOIS LTX SZ6.5 (GLOVE) ×4 IMPLANT
GLOVE SURG UNDER POLY LF SZ6.5 (GLOVE) ×6 IMPLANT
GLOVE SURG UNDER POLY LF SZ7 (GLOVE) ×4 IMPLANT
GOWN STRL REUS W/ TWL LRG LVL3 (GOWN DISPOSABLE) ×3 IMPLANT
GOWN STRL REUS W/TWL LRG LVL3 (GOWN DISPOSABLE) ×6
GRASPER SUT TROCAR 14GX15 (MISCELLANEOUS) ×2 IMPLANT
LIGASURE VESSEL 5MM BLUNT TIP (ELECTROSURGICAL) ×2 IMPLANT
NS IRRIG 1000ML POUR BTL (IV SOLUTION) ×2 IMPLANT
PACK LAPAROSCOPY BASIN (CUSTOM PROCEDURE TRAY) ×2 IMPLANT
PACK TRENDGUARD 450 HYBRID PRO (MISCELLANEOUS) ×1 IMPLANT
PAD OB MATERNITY 4.3X12.25 (PERSONAL CARE ITEMS) ×2 IMPLANT
SET TUBE SMOKE EVAC HIGH FLOW (TUBING) ×2 IMPLANT
SLEEVE ENDOPATH XCEL 5M (ENDOMECHANICALS) ×2 IMPLANT
SUT VICRYL 0 UR6 27IN ABS (SUTURE) ×2 IMPLANT
SUT VICRYL 4-0 PS2 18IN ABS (SUTURE) ×4 IMPLANT
TOWEL GREEN STERILE FF (TOWEL DISPOSABLE) ×2 IMPLANT
TRAY FOLEY W/BAG SLVR 14FR LF (SET/KITS/TRAYS/PACK) ×2 IMPLANT
TRENDGUARD 450 HYBRID PRO PACK (MISCELLANEOUS) ×2
TROCAR XCEL NON-BLD 11X100MML (ENDOMECHANICALS) ×2 IMPLANT
TROCAR XCEL NON-BLD 5MMX100MML (ENDOMECHANICALS) ×2 IMPLANT
WARMER LAPAROSCOPE (MISCELLANEOUS) ×2 IMPLANT

## 2021-06-21 NOTE — Op Note (Signed)
Pre Op Dx:   Desires permanent sterilization  Post Op Dx:   Same as pre-op  Procedure:  Laparoscopic bilateral salpingectomy  Surgeon:  Dr. Steva Ready Assistants:  Dr. Gerald Leitz (assistant needed due to complexity of the anatomy) Anesthesia:  General  EBL:  5cc  IVF:  See anesthesia documentation UOP:  350cc  Drains:  Foley catheter - removed at the end of the case Specimen removed:  Bilateral fallopian tubes - sent to pathology Device(s) implanted:  No Case Type:  Clean Findings: Normal-appearing uterus, bilateral fallopian tubes, and ovaries. Adhesive band between bladder and lower uterine segment. Normal-appearing liver contours. Complications: None Indications:  30 y.o. [id number] who desired permanent sterilization and declined all other forms of contraception, including vasectomy.  Description of procedure:  After informed consent the patient was taken to the operating room and placed in dorsal supine position where general endotracheal anesthesia was administered and found to be adequate.  She was placed in dorsal lithotomy position.  She was prepped and draped in the usual sterile fashion.  A Hulka uterine manipulator with and a Foley catheter were placed.  A timeout was called and the procedure confirmed. A Hulka tenaculum was placed. The abdomen was entered through a 25mm umbilical incision under direct visualization and a pneumoperitoneum was established.  Right and left 79mm lower quadrant ports were placed under visualization.  The entry point was visualized from an inferior port and atraumatic entry confirmed.  The left fallopian tube was removed from its attachment to the normal-appearing ovary starting at the fimbria and divided along the mesosalpinx to the level of the uterus. The tube was desiccated thoroughly and divided from the uterus. This process was repeated on the contralateral side. The specimens were removed.  Hemostasis confirmed. The 40mm port site was closed with  a fascia closure system under direct visualization using 0 Vicryl.  The remaining ports were withdrawn under direct visualization.  The pneumoperitoneum was reduced completely and the camera port withdrawn under visualization.  The skin was closed with 4-0 Vicryl in subcuticular fashion. The foley catheter and Hulka tenaculum were removed.  The patient was returned to dorsal supine position, awakened and extubated in the OR having appeared to tolerate the procedure well.  All sponge, needle, and instrument counts were correct x 2 at the end of the case.    Disposition:  PACU  Steva Ready, DO

## 2021-06-21 NOTE — Anesthesia Postprocedure Evaluation (Signed)
Anesthesia Post Note  Patient: Rachel Weiss  Procedure(s) Performed: LAPAROSCOPIC BILATERAL SALPINGECTOMY (Bilateral: Abdomen)     Patient location during evaluation: PACU Anesthesia Type: General Level of consciousness: awake and alert Pain management: pain level controlled Vital Signs Assessment: post-procedure vital signs reviewed and stable Respiratory status: spontaneous breathing, nonlabored ventilation, respiratory function stable and patient connected to nasal cannula oxygen Cardiovascular status: blood pressure returned to baseline and stable Postop Assessment: no apparent nausea or vomiting Anesthetic complications: no   No notable events documented.  Last Vitals:  Vitals:   06/21/21 1145 06/21/21 1158  BP: 120/85 (!) 139/97  Pulse: 78   Resp: 14 14  Temp:  36.5 C  SpO2: 97% 98%    Last Pain:  Vitals:   06/21/21 1159  TempSrc:   PainSc: 6                  Arianna Delsanto

## 2021-06-21 NOTE — Interval H&P Note (Signed)
History and Physical Interval Note:  06/21/2021 9:28 AM  Rachel Weiss  has presented today for surgery, with the diagnosis of Sterilization.  The various methods of treatment have been discussed with the patient and family. After consideration of risks, benefits and other options for treatment, the patient has consented to  Procedure(s): LAPAROSCOPIC BILATERAL SALPINGECTOMY (Bilateral) as a surgical intervention.  The patient's history has been reviewed, patient examined, no change in status, stable for surgery.  I have reviewed the patient's chart and labs.  Questions were answered to the patient's satisfaction.     Steva Ready

## 2021-06-21 NOTE — Transfer of Care (Signed)
Immediate Anesthesia Transfer of Care Note  Patient: Rachel Weiss  Procedure(s) Performed: LAPAROSCOPIC BILATERAL SALPINGECTOMY (Bilateral: Abdomen)  Patient Location: PACU  Anesthesia Type:General  Level of Consciousness: awake, alert , oriented and patient cooperative  Airway & Oxygen Therapy: Patient Spontanous Breathing and Patient connected to face mask oxygen  Post-op Assessment: Report given to RN and Post -op Vital signs reviewed and stable  Post vital signs: Reviewed and stable  Last Vitals:  Vitals Value Taken Time  BP    Temp    Pulse    Resp    SpO2      Last Pain:  Vitals:   06/21/21 0844  TempSrc: Oral  PainSc: 0-No pain      Patients Stated Pain Goal: 7 (06/21/21 0844)  Complications: No notable events documented.

## 2021-06-21 NOTE — Anesthesia Procedure Notes (Signed)
Procedure Name: Intubation Date/Time: 06/21/2021 9:41 AM Performed by: Signe Colt, CRNA Pre-anesthesia Checklist: Patient identified, Emergency Drugs available, Suction available and Patient being monitored Patient Re-evaluated:Patient Re-evaluated prior to induction Oxygen Delivery Method: Circle system utilized Preoxygenation: Pre-oxygenation with 100% oxygen Induction Type: IV induction Ventilation: Mask ventilation without difficulty Laryngoscope Size: Mac and 3 Grade View: Grade I Tube type: Oral Tube size: 7.0 mm Number of attempts: 1 Airway Equipment and Method: Stylet and Oral airway Placement Confirmation: ETT inserted through vocal cords under direct vision, positive ETCO2 and breath sounds checked- equal and bilateral Secured at: 21 cm Tube secured with: Tape Dental Injury: Teeth and Oropharynx as per pre-operative assessment

## 2021-06-21 NOTE — Discharge Instructions (Signed)
No ibuprofen until 4:30pm Post Anesthesia Home Care Instructions  Activity: Get plenty of rest for the remainder of the day. A responsible individual must stay with you for 24 hours following the procedure.  For the next 24 hours, DO NOT: -Drive a car -Advertising copywriter -Drink alcoholic beverages -Take any medication unless instructed by your physician -Make any legal decisions or sign important papers.  Meals: Start with liquid foods such as gelatin or soup. Progress to regular foods as tolerated. Avoid greasy, spicy, heavy foods. If nausea and/or vomiting occur, drink only clear liquids until the nausea and/or vomiting subsides. Call your physician if vomiting continues.  Special Instructions/Symptoms: Your throat may feel dry or sore from the anesthesia or the breathing tube placed in your throat during surgery. If this causes discomfort, gargle with warm salt water. The discomfort should disappear within 24 hours.  If you had a scopolamine patch placed behind your ear for the management of post- operative nausea and/or vomiting:  1. The medication in the patch is effective for 72 hours, after which it should be removed.  Wrap patch in a tissue and discard in the trash. Wash hands thoroughly with soap and water. 2. You may remove the patch earlier than 72 hours if you experience unpleasant side effects which may include dry mouth, dizziness or visual disturbances. 3. Avoid touching the patch. Wash your hands with soap and water after contact with the patch.

## 2021-06-22 LAB — SURGICAL PATHOLOGY

## 2021-06-23 LAB — BPAM RBC
Blood Product Expiration Date: 202210252359
Blood Product Expiration Date: 202210262359
Unit Type and Rh: 5100
Unit Type and Rh: 5100

## 2021-06-23 LAB — TYPE AND SCREEN
ABO/RH(D): O POS
Antibody Screen: POSITIVE
Unit division: 0
Unit division: 0

## 2021-06-26 ENCOUNTER — Encounter (HOSPITAL_BASED_OUTPATIENT_CLINIC_OR_DEPARTMENT_OTHER): Payer: Self-pay | Admitting: Obstetrics and Gynecology

## 2021-07-03 DIAGNOSIS — Z9079 Acquired absence of other genital organ(s): Secondary | ICD-10-CM | POA: Diagnosis not present

## 2022-06-13 IMAGING — US US RENAL
1 series · 15 of 25 positions shown · non-contrast
Comparison: None.

CLINICAL DATA: Left flank pain. Hematuria. Thirty-seven weeks
pregnant.

EXAM:
RENAL / URINARY TRACT ULTRASOUND COMPLETE

[Series 1: us renal · 15 of 36 slices shown]
[im 1/36]
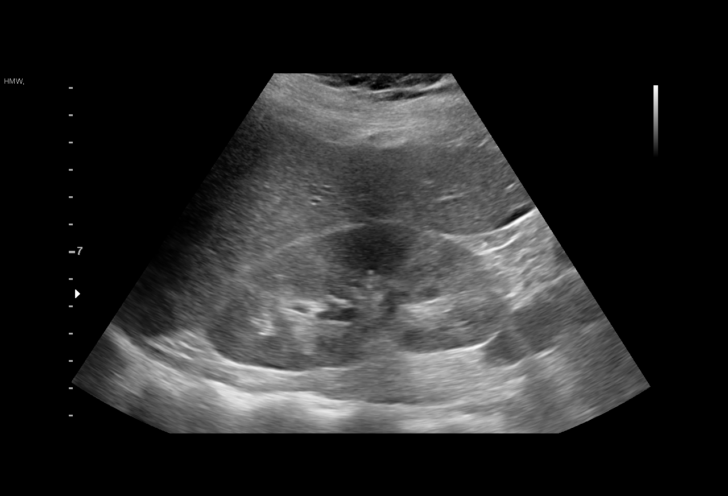
[im 3/36]
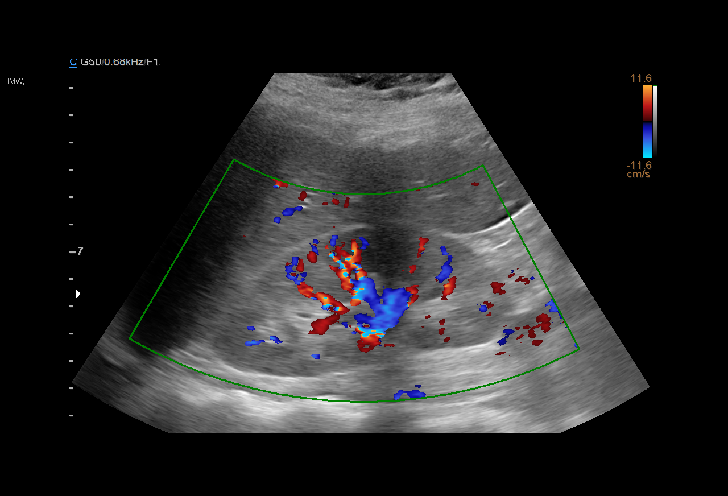
[im 6/36]
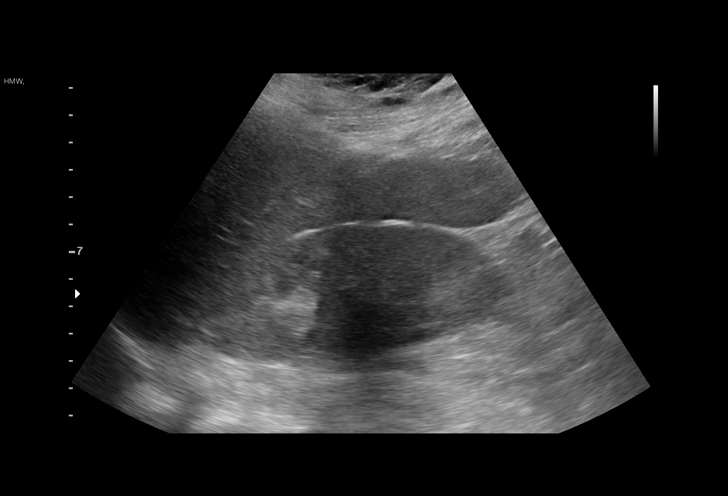
[im 8/36]
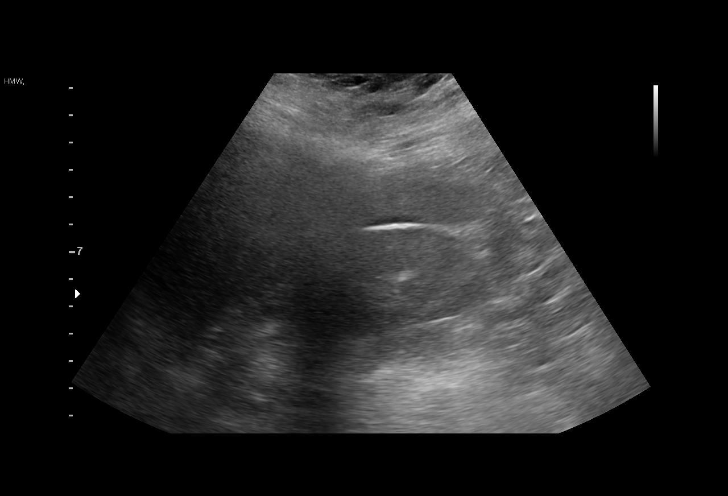
[im 11/36]
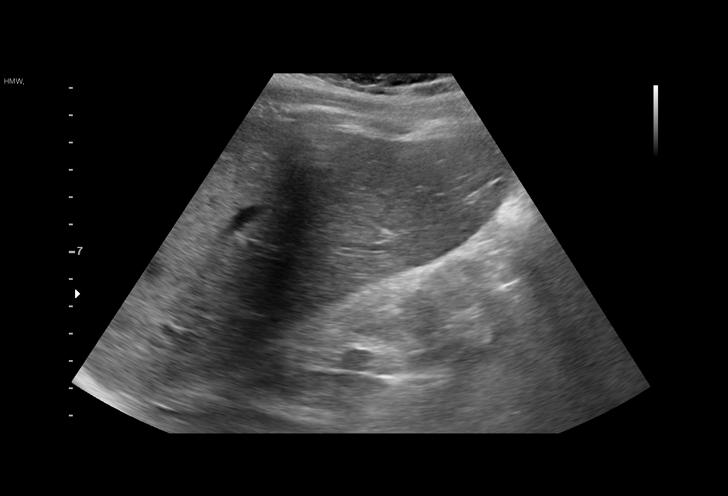
[im 14/36]
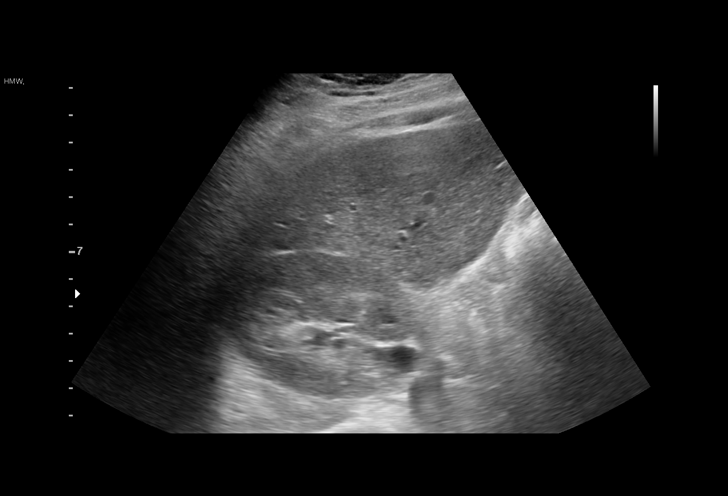
[im 15/36]
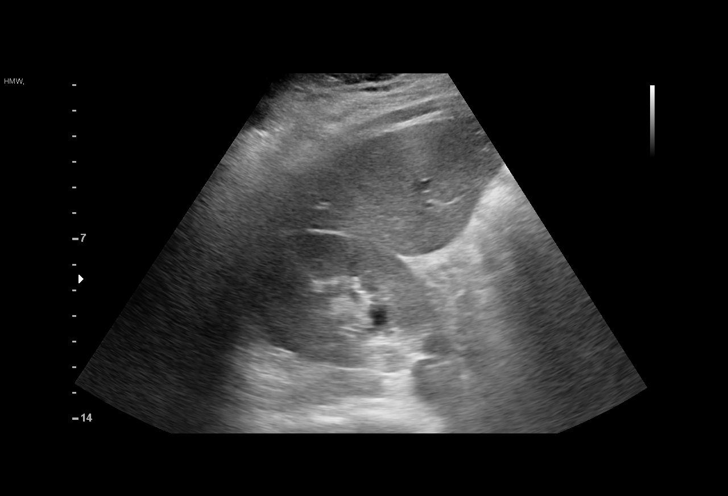
[im 18/36]
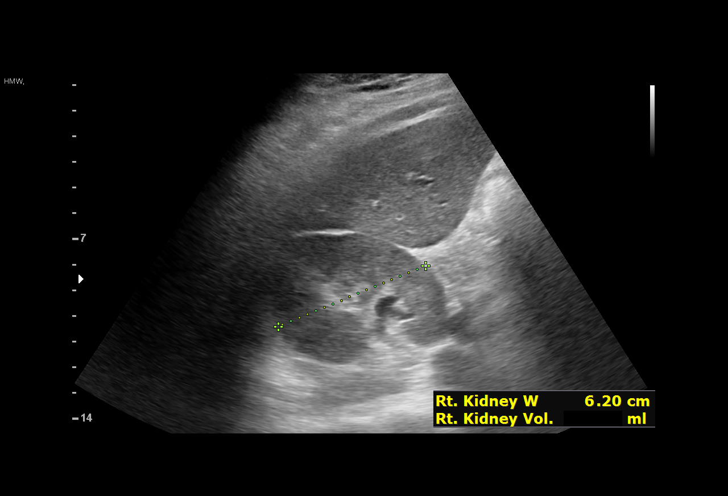
[im 21/36]
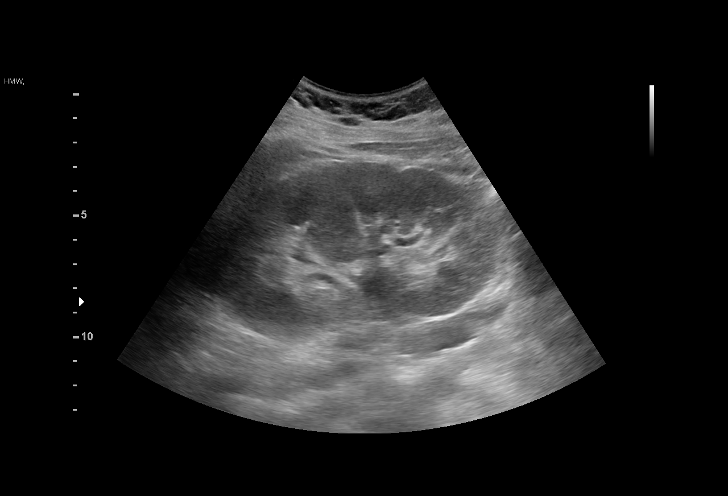
[im 22/36]
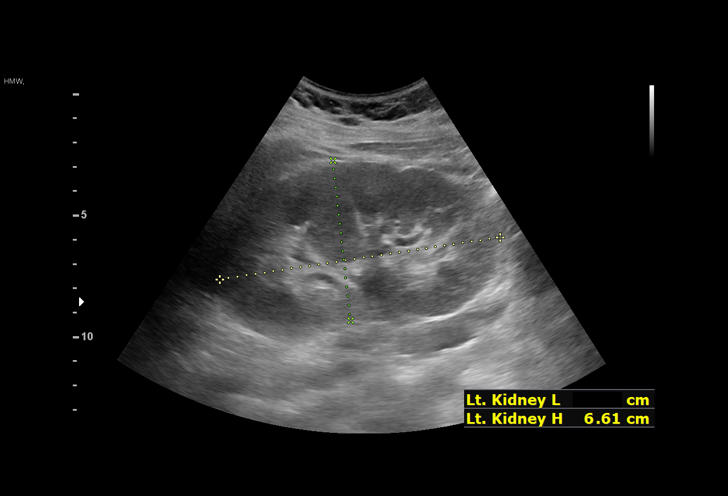
[im 25/36]
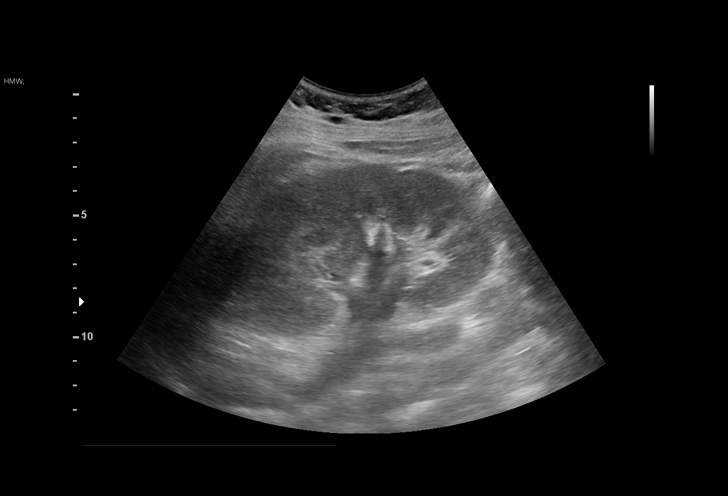
[im 28/36]
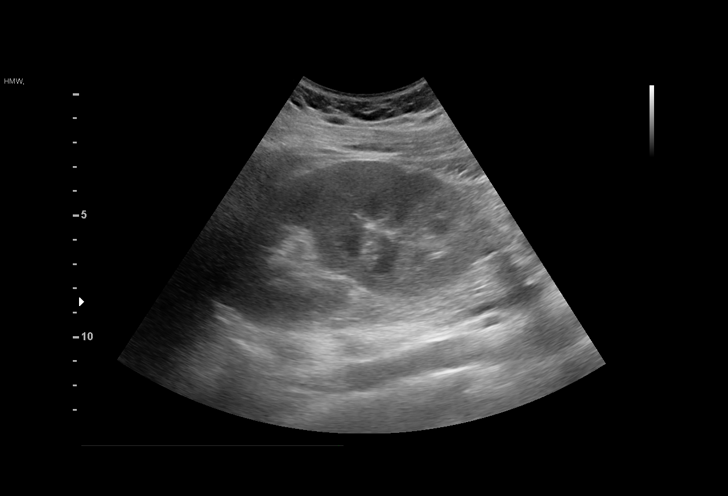
[im 30/36]
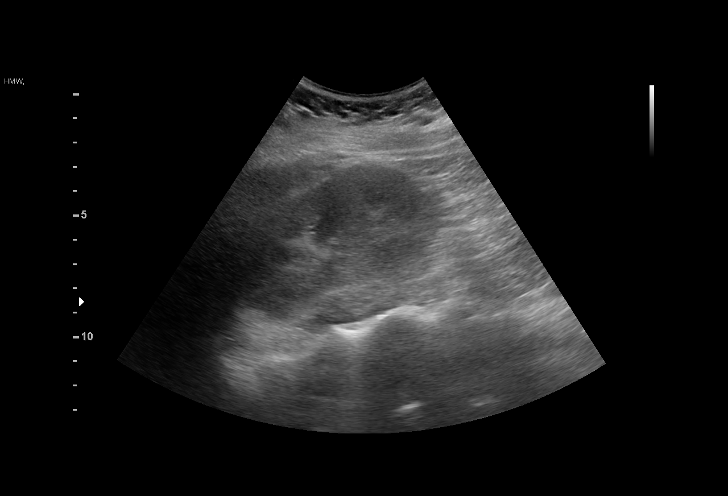
[im 33/36]
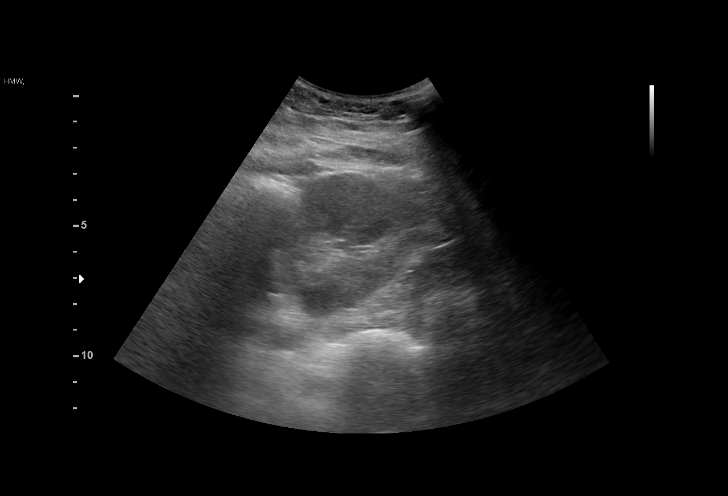
[im 36/36]
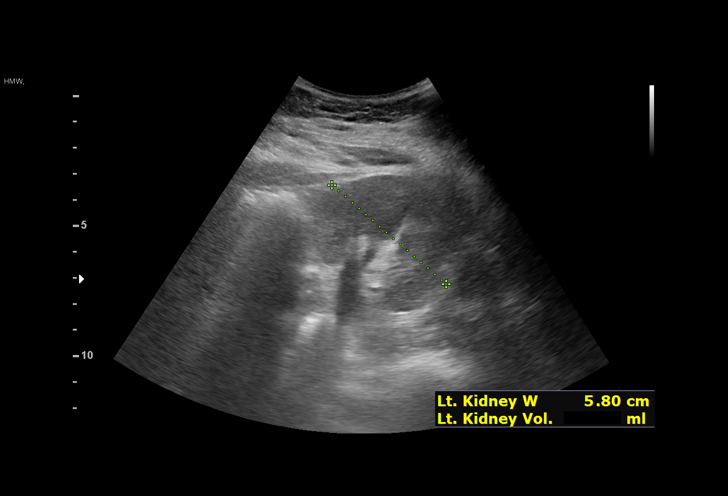

[15 of 25 positions shown; findings below may reference images not displayed]

FINDINGS: Right Kidney:

Renal measurements: 11.2 x 5.6 x 6.2 cm = volume: 195 mL.
Echogenicity within normal limits. No mass or hydronephrosis
visualized.

Left Kidney:

Renal measurements: 11.7 x 6.6 x 5.8 cm = volume: 234 mL.
Echogenicity within normal limits. No mass or hydronephrosis
visualized.

Urinary bladder:

Appears normal for degree of bladder distention.

Other:

None.
IMPRESSION: Unremarkable renal ultrasound.

## 2023-02-06 ENCOUNTER — Telehealth: Payer: Self-pay

## 2023-02-06 NOTE — Telephone Encounter (Signed)
LVM for patient to call back. AS, CMA
